# Patient Record
Sex: Male | Born: 1955 | Hispanic: Yes | Marital: Single | Smoking: Never smoker
Health system: Southern US, Community
[De-identification: ages and names within clinical notes are randomized; demographics above are authoritative.]

---

## 1997-11-21 ENCOUNTER — Other Ambulatory Visit: Admission: RE | Admit: 1997-11-21 | Discharge: 1997-11-21 | Payer: Self-pay | Admitting: Oral Surgery

## 2007-01-08 HISTORY — PX: OTHER SURGICAL HISTORY: SHX169

## 2014-10-05 ENCOUNTER — Other Ambulatory Visit: Payer: Self-pay

## 2014-10-05 ENCOUNTER — Other Ambulatory Visit: Payer: Self-pay | Admitting: Family Medicine

## 2014-10-06 LAB — CMP12+LP+TP+TSH+6AC+PSA+CBC…
A/G RATIO: 1.7 (ref 1.1–2.5)
ALBUMIN: 4.1 g/dL (ref 3.6–4.8)
ALT: 16 IU/L (ref 0–44)
AST: 25 IU/L (ref 0–40)
Alkaline Phosphatase: 90 IU/L (ref 39–117)
BUN/Creatinine Ratio: 15 (ref 10–22)
BUN: 12 mg/dL (ref 8–27)
Basophils Absolute: 0 10*3/uL (ref 0.0–0.2)
Basos: 1 %
Bilirubin Total: 0.7 mg/dL (ref 0.0–1.2)
CHOLESTEROL TOTAL: 191 mg/dL (ref 100–199)
CREATININE: 0.82 mg/dL (ref 0.76–1.27)
Calcium: 8.9 mg/dL (ref 8.6–10.2)
Chloride: 99 mmol/L (ref 97–108)
Chol/HDL Ratio: 3.8 ratio units (ref 0.0–5.0)
EOS (ABSOLUTE): 0.1 10*3/uL (ref 0.0–0.4)
Eos: 2 %
Estimated CHD Risk: 0.7 times avg. (ref 0.0–1.0)
FREE THYROXINE INDEX: 2.1 (ref 1.2–4.9)
GFR, EST AFRICAN AMERICAN: 111 mL/min/{1.73_m2} (ref >59)
GFR, EST NON AFRICAN AMERICAN: 96 mL/min/{1.73_m2} (ref >59)
GGT: 9 IU/L (ref 0–65)
GLUCOSE: 100 mg/dL — AB (ref 65–99)
Globulin, Total: 2.4 g/dL (ref 1.5–4.5)
HDL: 50 mg/dL (ref >39)
Hematocrit: 43.1 % (ref 37.5–51.0)
Hemoglobin: 14.6 g/dL (ref 12.6–17.7)
IRON: 116 ug/dL (ref 38–169)
Immature Grans (Abs): 0 10*3/uL (ref 0.0–0.1)
Immature Granulocytes: 0 %
LDH: 197 IU/L (ref 121–224)
LDL Calculated: 126 mg/dL — ABNORMAL HIGH (ref 0–99)
LYMPHS ABS: 1.3 10*3/uL (ref 0.7–3.1)
Lymphs: 32 %
MCH: 31.3 pg (ref 26.6–33.0)
MCHC: 33.9 g/dL (ref 31.5–35.7)
MCV: 92 fL (ref 79–97)
MONOS ABS: 0.4 10*3/uL (ref 0.1–0.9)
Monocytes: 11 %
NEUTROS ABS: 2.2 10*3/uL (ref 1.4–7.0)
Neutrophils: 54 %
PHOSPHORUS: 2.4 mg/dL — AB (ref 2.5–4.5)
POTASSIUM: 4 mmol/L (ref 3.5–5.2)
Platelets: 233 10*3/uL (ref 150–379)
Prostate Specific Ag, Serum: 0.9 ng/mL (ref 0.0–4.0)
RBC: 4.67 x10E6/uL (ref 4.14–5.80)
RDW: 13.3 % (ref 12.3–15.4)
Sodium: 139 mmol/L (ref 134–144)
T3 UPTAKE RATIO: 25 % (ref 24–39)
T4 TOTAL: 8.5 ug/dL (ref 4.5–12.0)
TOTAL PROTEIN: 6.5 g/dL (ref 6.0–8.5)
TSH: 1.17 u[IU]/mL (ref 0.450–4.500)
Triglycerides: 77 mg/dL (ref 0–149)
URIC ACID: 4.4 mg/dL (ref 3.7–8.6)
VLDL Cholesterol Cal: 15 mg/dL (ref 5–40)
WBC: 4.1 10*3/uL (ref 3.4–10.8)

## 2014-10-06 LAB — HGB A1C W/O EAG: HEMOGLOBIN A1C: 5.9 % — AB (ref 4.8–5.6)

## 2015-07-31 ENCOUNTER — Other Ambulatory Visit: Payer: Self-pay | Admitting: Family Medicine

## 2015-08-01 LAB — CMP12+LP+TP+TSH+6AC+PSA+CBC…
A/G RATIO: 1.7 (ref 1.2–2.2)
ALBUMIN: 4.4 g/dL (ref 3.6–4.8)
ALK PHOS: 80 IU/L (ref 39–117)
ALT: 16 IU/L (ref 0–44)
AST: 26 IU/L (ref 0–40)
BASOS ABS: 0 10*3/uL (ref 0.0–0.2)
BUN/Creatinine Ratio: 25 — ABNORMAL HIGH (ref 10–24)
BUN: 23 mg/dL (ref 8–27)
Basos: 1 %
Bilirubin Total: 0.9 mg/dL (ref 0.0–1.2)
CALCIUM: 9.1 mg/dL (ref 8.6–10.2)
CHOLESTEROL TOTAL: 181 mg/dL (ref 100–199)
Chloride: 100 mmol/L (ref 96–106)
Chol/HDL Ratio: 3.1 ratio units (ref 0.0–5.0)
Creatinine, Ser: 0.92 mg/dL (ref 0.76–1.27)
EOS (ABSOLUTE): 0.1 10*3/uL (ref 0.0–0.4)
Eos: 1 %
Estimated CHD Risk: <0.5 times avg. (ref 0.0–1.0)
FREE THYROXINE INDEX: 2.1 (ref 1.2–4.9)
GFR calc Af Amer: 103 mL/min/{1.73_m2} (ref >59)
GFR calc non Af Amer: 89 mL/min/{1.73_m2} (ref >59)
GGT: 9 IU/L (ref 0–65)
GLOBULIN, TOTAL: 2.6 g/dL (ref 1.5–4.5)
Glucose: 100 mg/dL — ABNORMAL HIGH (ref 65–99)
HDL: 58 mg/dL (ref >39)
Hematocrit: 41.1 % (ref 37.5–51.0)
Hemoglobin: 14.2 g/dL (ref 12.6–17.7)
IMMATURE GRANULOCYTES: 0 %
Immature Grans (Abs): 0 10*3/uL (ref 0.0–0.1)
Iron: 138 ug/dL (ref 38–169)
LDH: 200 IU/L (ref 121–224)
LDL Calculated: 108 mg/dL — ABNORMAL HIGH (ref 0–99)
LYMPHS ABS: 1.6 10*3/uL (ref 0.7–3.1)
Lymphs: 38 %
MCH: 31.2 pg (ref 26.6–33.0)
MCHC: 34.5 g/dL (ref 31.5–35.7)
MCV: 90 fL (ref 79–97)
MONOS ABS: 0.4 10*3/uL (ref 0.1–0.9)
Monocytes: 9 %
NEUTROS PCT: 51 %
Neutrophils Absolute: 2.2 10*3/uL (ref 1.4–7.0)
PLATELETS: 256 10*3/uL (ref 150–379)
PROSTATE SPECIFIC AG, SERUM: 1.2 ng/mL (ref 0.0–4.0)
Phosphorus: 2.6 mg/dL (ref 2.5–4.5)
Potassium: 4.4 mmol/L (ref 3.5–5.2)
RBC: 4.55 x10E6/uL (ref 4.14–5.80)
RDW: 13.3 % (ref 12.3–15.4)
SODIUM: 142 mmol/L (ref 134–144)
T3 Uptake Ratio: 25 % (ref 24–39)
T4 TOTAL: 8.3 ug/dL (ref 4.5–12.0)
TRIGLYCERIDES: 74 mg/dL (ref 0–149)
TSH: 1.02 u[IU]/mL (ref 0.450–4.500)
Total Protein: 7 g/dL (ref 6.0–8.5)
Uric Acid: 5.1 mg/dL (ref 3.7–8.6)
VLDL Cholesterol Cal: 15 mg/dL (ref 5–40)
WBC: 4.3 10*3/uL (ref 3.4–10.8)

## 2015-08-01 LAB — HGB A1C W/O EAG: Hgb A1c MFr Bld: 5.6 % (ref 4.8–5.6)

## 2016-07-23 ENCOUNTER — Ambulatory Visit: Payer: Self-pay | Admitting: *Deleted

## 2016-07-23 VITALS — BP 123/67 | HR 73 | Ht 66.5 in | Wt 137.0 lb

## 2016-07-23 DIAGNOSIS — Z0000 Encounter for general adult medical examination without abnormal findings: Secondary | ICD-10-CM

## 2016-07-23 NOTE — Progress Notes (Signed)
[employer] insurance premium discount evaluation: Labs Drawn. Replacements ROI form signed. Tobacco Free Attestation form signed.  Forms placed in paper chart.  

## 2016-07-24 LAB — CMP12+LP+TP+TSH+6AC+PSA+CBC…
A/G RATIO: 1.6 (ref 1.2–2.2)
ALBUMIN: 4.2 g/dL (ref 3.6–4.8)
ALT: 20 IU/L (ref 0–44)
AST: 22 IU/L (ref 0–40)
Alkaline Phosphatase: 84 IU/L (ref 39–117)
BASOS ABS: 0 10*3/uL (ref 0.0–0.2)
BILIRUBIN TOTAL: 0.3 mg/dL (ref 0.0–1.2)
BUN/Creatinine Ratio: 21 (ref 10–24)
BUN: 18 mg/dL (ref 8–27)
Basos: 1 %
CHOLESTEROL TOTAL: 176 mg/dL (ref 100–199)
Calcium: 8.9 mg/dL (ref 8.6–10.2)
Chloride: 101 mmol/L (ref 96–106)
Chol/HDL Ratio: 3.3 ratio (ref 0.0–5.0)
Creatinine, Ser: 0.87 mg/dL (ref 0.76–1.27)
EOS (ABSOLUTE): 0 10*3/uL (ref 0.0–0.4)
Eos: 1 %
FREE THYROXINE INDEX: 2.1 (ref 1.2–4.9)
GFR calc non Af Amer: 92 mL/min/{1.73_m2} (ref >59)
GFR, EST AFRICAN AMERICAN: 107 mL/min/{1.73_m2} (ref >59)
GGT: 7 IU/L (ref 0–65)
Globulin, Total: 2.7 g/dL (ref 1.5–4.5)
Glucose: 100 mg/dL — ABNORMAL HIGH (ref 65–99)
HDL: 53 mg/dL (ref >39)
Hematocrit: 42.1 % (ref 37.5–51.0)
Hemoglobin: 14.2 g/dL (ref 13.0–17.7)
IRON: 51 ug/dL (ref 38–169)
Immature Grans (Abs): 0 10*3/uL (ref 0.0–0.1)
Immature Granulocytes: 0 %
LDH: 211 IU/L (ref 121–224)
LDL Calculated: 113 mg/dL — ABNORMAL HIGH (ref 0–99)
LYMPHS ABS: 1.4 10*3/uL (ref 0.7–3.1)
Lymphs: 34 %
MCH: 31.5 pg (ref 26.6–33.0)
MCHC: 33.7 g/dL (ref 31.5–35.7)
MCV: 93 fL (ref 79–97)
MONOCYTES: 8 %
MONOS ABS: 0.3 10*3/uL (ref 0.1–0.9)
NEUTROS ABS: 2.3 10*3/uL (ref 1.4–7.0)
Neutrophils: 56 %
PLATELETS: 250 10*3/uL (ref 150–379)
POTASSIUM: 4.4 mmol/L (ref 3.5–5.2)
Phosphorus: 2.5 mg/dL (ref 2.5–4.5)
Prostate Specific Ag, Serum: 0.8 ng/mL (ref 0.0–4.0)
RBC: 4.51 x10E6/uL (ref 4.14–5.80)
RDW: 13.2 % (ref 12.3–15.4)
Sodium: 140 mmol/L (ref 134–144)
T3 UPTAKE RATIO: 27 % (ref 24–39)
T4 TOTAL: 7.9 ug/dL (ref 4.5–12.0)
TOTAL PROTEIN: 6.9 g/dL (ref 6.0–8.5)
TSH: 1.3 u[IU]/mL (ref 0.450–4.500)
Triglycerides: 51 mg/dL (ref 0–149)
Uric Acid: 4.3 mg/dL (ref 3.7–8.6)
VLDL CHOLESTEROL CAL: 10 mg/dL (ref 5–40)
WBC: 4 10*3/uL (ref 3.4–10.8)

## 2016-07-24 LAB — HGB A1C W/O EAG: HEMOGLOBIN A1C: 5.6 % (ref 4.8–5.6)

## 2016-07-25 NOTE — Progress Notes (Signed)
Results reviewed with pt. Diet and exercise recommendations for improving LDL and A1c given. All other labs unremarkable. No pcp to route to. Pt provided with copy.

## 2017-03-27 ENCOUNTER — Ambulatory Visit: Payer: Self-pay | Admitting: Registered Nurse

## 2017-03-27 VITALS — BP 137/70 | HR 81 | Temp 97.8°F | Resp 16

## 2017-03-27 DIAGNOSIS — S46811A Strain of other muscles, fascia and tendons at shoulder and upper arm level, right arm, initial encounter: Secondary | ICD-10-CM

## 2017-03-27 DIAGNOSIS — S46911A Strain of unspecified muscle, fascia and tendon at shoulder and upper arm level, right arm, initial encounter: Secondary | ICD-10-CM

## 2017-03-27 DIAGNOSIS — R2231 Localized swelling, mass and lump, right upper limb: Secondary | ICD-10-CM

## 2017-03-27 NOTE — Progress Notes (Signed)
Subjective:    Patient ID: Brian Long, male    DOB: 11-07-1955, 62 y.o.   MRN: [id number]  62y/o [place] male established patient reports upper R back and shoulder pain x1.5 weeks. Worse with certain movements or exertion. Endorses repetitive motion with job duties. Has not tried anything for pain relief at home except stretches.  Reported he has had back pain in the past was told he had arthritis but this is different sometimes radiating into right arm and he has also noticed he had a lump in his right hand.  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  Denied trauma.  Daily repetitive motions for his work/static positions.  Noticed neck/shoulder tightness last summer because it impinged his swimming stroke but it has recently been worsening.  Denied red flags loss of bowel/bladder control, saddle paresthesias and/or arm/leg weakness.     Review of Systems  Constitutional: Negative for appetite change, chills, diaphoresis, fatigue and fever.  HENT: Negative for trouble swallowing and voice change.   Eyes: Negative for photophobia and visual disturbance.  Respiratory: Negative for cough and shortness of breath.   Cardiovascular: Negative for chest pain.  Gastrointestinal: Negative for diarrhea, nausea and vomiting.  Endocrine: Negative for cold intolerance and heat intolerance.  Genitourinary: Negative for decreased urine volume and enuresis.  Musculoskeletal: Positive for back pain, myalgias and neck pain. Negative for arthralgias, gait problem, joint swelling and neck stiffness.  Skin: Negative for color change, pallor, rash and wound.  Allergic/Immunologic: Negative for environmental allergies and food allergies.  Neurological: Negative for dizziness, tremors, seizures, syncope, facial asymmetry, speech difficulty, weakness, light-headedness, numbness and headaches.  Hematological: Negative for adenopathy. Does not bruise/bleed easily.   Psychiatric/Behavioral: Negative for agitation, confusion and sleep disturbance.       Objective:   Physical Exam  Constitutional: He is oriented to person, place, and time. Vital signs are normal. He appears well-developed and well-nourished. He is cooperative.  Non-toxic appearance. He does not have a sickly appearance. He does not appear ill. No distress.  HENT:  Head: Normocephalic and atraumatic.  Right Ear: Hearing and external ear normal.  Left Ear: Hearing and external ear normal.  Nose: Nose normal.  Mouth/Throat: Uvula is midline, oropharynx is clear and moist and mucous membranes are normal. No oropharyngeal exudate.  Eyes: Pupils are equal, round, and reactive to light. Conjunctivae, EOM and lids are normal. Right eye exhibits no discharge. Left eye exhibits no discharge. No scleral icterus.  Neck: Trachea normal, normal range of motion and phonation normal. Neck supple. Muscular tenderness present. No spinous process tenderness present. No neck rigidity. No tracheal deviation, no edema, no erythema and normal range of motion present. No thyromegaly present.  Bilateral trapezius muscles tight and slightly TTP; full AROM  Cardiovascular: Normal rate, regular rhythm, S1 normal, S2 normal, normal heart sounds, intact distal pulses and normal pulses. PMI is not displaced. Exam reveals no gallop, no distant heart sounds and no friction rub.  Pulses:      Radial pulses are 2+ on the right side, and 2+ on the left side.  Pulmonary/Chest: Effort normal and breath sounds normal. No accessory muscle usage or stridor. No respiratory distress. He has no decreased breath sounds. He has no wheezes. He has no rhonchi. He has no rales. He exhibits no tenderness.  Abdominal: Soft. Normal appearance. He exhibits no distension, no fluid wave and no ascites. There is no tenderness. There is no rigidity and no guarding.  Musculoskeletal: He  exhibits tenderness and deformity. He exhibits no edema.        Right shoulder: He exhibits decreased range of motion, tenderness, pain and spasm. He exhibits no bony tenderness, no swelling, no effusion, no crepitus, no deformity, no laceration, normal pulse and normal strength.       Left shoulder: Normal.       Right elbow: Normal.      Left elbow: Normal.       Right wrist: Normal.       Left wrist: Normal.       Right hip: Normal.       Left hip: Normal.       Right knee: Normal.       Left knee: Normal.       Right ankle: Normal.       Left ankle: Normal.       Cervical back: He exhibits tenderness, pain and spasm. He exhibits no bony tenderness, no swelling, no edema, no deformity, no laceration and normal pulse.       Thoracic back: He exhibits normal range of motion, no tenderness, no bony tenderness, no swelling, no edema, no deformity, no laceration, no pain, no spasm and normal pulse.       Lumbar back: Normal. He exhibits normal range of motion, no tenderness, no bony tenderness, no swelling, no edema, no deformity, no laceration, no pain, no spasm and normal pulse.       Right upper arm: Normal.       Left upper arm: Normal.       Right forearm: He exhibits tenderness. He exhibits no bony tenderness, no swelling, no edema, no deformity and no laceration.       Left forearm: Normal.       Right hand: He exhibits tenderness. He exhibits normal range of motion, no bony tenderness, normal two-point discrimination, normal capillary refill, no deformity and no laceration. Normal sensation noted. Normal strength noted. He exhibits no finger abduction, no thumb/finger opposition and no wrist extension trouble.       Left hand: Normal.       Hands: Pea sized nonmobile nodule TTP in snuff box right hand no increased temperature no rash; full finger AROM and strength bilaterally equal; right hand more calloused than left; right forearm flexor carpi ulnaris TTP mildly; right subscapularis TTP superior, right levator scapulae, supraspinatus and posterior  medial superior deltoid and superior/medial bilateral trapezius TTP/spasm; right external shoulder rotation decreased 15 degrees from left; full internal rotation and atchley scratch test decreased AROM right above head but equal to left at waist; negative empty can test bilaterally; full cross body reach equal bilaterally  Lymphadenopathy:       Head (right side): No submental, no submandibular, no tonsillar, no preauricular, no posterior auricular and no occipital adenopathy present.       Head (left side): No submental, no submandibular, no tonsillar, no preauricular, no posterior auricular and no occipital adenopathy present.    He has no cervical adenopathy.       Right cervical: No superficial cervical, no deep cervical and no posterior cervical adenopathy present.      Left cervical: No superficial cervical, no deep cervical and no posterior cervical adenopathy present.  Neurological: He is alert and oriented to person, place, and time. He has normal strength. He is not disoriented. He displays no atrophy, no tremor and normal reflexes. No cranial nerve deficit or sensory deficit. He exhibits normal muscle tone. He displays no seizure  activity. Coordination and gait normal. GCS eye subscore is 4. GCS verbal subscore is 5. GCS motor subscore is 6.  Reflex Scores:      Patellar reflexes are 2+ on the right side and 2+ on the left side.      Achilles reflexes are 2+ on the right side and 2+ on the left side. Skin: Skin is warm, dry and intact. No rash noted. He is not diaphoretic. No cyanosis or erythema. No pallor. Nails show no clubbing.  Psychiatric: He has a normal mood and affect. His speech is normal and behavior is normal. Judgment and thought content normal. Cognition and memory are normal.  Nursing note and vitals reviewed.         Assessment & Plan:  A-initial encounter acute right shoulder strain/trapezius strain and right hand swelling  P-cyclobenazeprine/flexeril 10mg  take 1/2  to 1 tab po TID prn muscle spasms #30 RF0 dispensed from [facility].  Ibuprofen 800mg  po TID prn pain #30 RF0 dispensed from [facility].  Avoid alcohol intake and driving while taking cyclobenazeprine/flexeril as drowsiness common side effect.  Slow position changes as medication also lower blood pressure.  Home stretches demonstrated to patient-e.g. Arm circles, spiders walking up wall, chest stretches, neck AROM, chin tucks, knee to chest and rock side to side on back. Self massage or professional prn, foam roller use or tennis/racquetball.  Heat/cryotherapy 15 minutes QID prn.  Trial thermacare 1 applied and another given to patient for use tomorrow from clinic stock.  Applied biofreeze to forearm and scapula/trapezius in clinic also gave 4 UD for trial at home from clinic stock gel 1% apply QID.  Start home PT TID given rehab exercise handouts from [facility].   Consider physical therapy referral if no improvement with prescribed therapy from [facility] and/or chiropractic care.  Patient prefers not to use chiropractor.  Ensure ergonomics correct desk at work avoid repetitive motions if possible/holding phone/laptop in hand use desk/stand and/or break up lifting items into smaller loads/weights.  Patient was instructed to rest, ice, and ROM exercises.  Activity as tolerated.   Follow up if symptoms persist or worsen especially if loss of bowel/bladder control, arm/leg weakness e.g. Dropping items/falls and/or saddle paresthesias.  [facility] handout on muscle spasms and shoulder/neck rehab exercises/strain given to patient.  Patient verbalized agreement and understanding of treatment plan and had no further questions at this time.  P2:  Injury Prevention and Fitness.  Discussed with patient possible ganglion cyst right hand or neuroma.  Consider imaging and orthopedic consult.  Trial ice/NSAID motrin 800mg  po TID take with food.  Follow up re-evaluation in 2 weeks sooner if redness, worsening swelling/enlarging nodule, worsening  pain, hand weakness.  [facility] handout on ganglion cyst to patient.  Patient verbalized understanding information, instructions, agreed with plan of care and had no further questions at this time.

## 2017-03-27 NOTE — Patient Instructions (Addendum)
Ganglion Cyst A ganglion cyst is a noncancerous, fluid-filled lump that occurs near joints or tendons. The ganglion cyst grows out of a joint or the lining of a tendon. It most often develops in the hand or wrist, but it can also develop in the shoulder, elbow, hip, knee, ankle, or foot. The round or oval ganglion cyst can be the size of a pea or larger than a grape. Increased activity may enlarge the size of the cyst because more fluid starts to build up. What are the causes? It is not known what causes a ganglion cyst to grow. However, it may be related to:  Inflammation or irritation around the joint.  An injury.  Repetitive movements or overuse.  Arthritis.  What increases the risk? Risk factors include:  Being a woman.  Being age 58-50.  What are the signs or symptoms? Symptoms may include:  A lump. This most often appears on the hand or wrist, but it can occur in other areas of the body.  Tingling.  Pain.  Numbness.  Muscle weakness.  Weak grip.  Less movement in a joint.  How is this diagnosed? Ganglion cysts are most often diagnosed based on a physical exam. Your health care provider will feel the lump and may shine a light alongside it. If it is a ganglion cyst, a light often shines through it. Your health care provider may order an X-ray, ultrasound, or MRI to rule out other conditions. How is this treated? Ganglion cysts usually go away on their own without treatment. If pain or other symptoms are involved, treatment may be needed. Treatment is also needed if the ganglion cyst limits your movement or if it gets infected. Treatment may include:  Wearing a brace or splint on your wrist or finger.  Taking anti-inflammatory medicine.  Draining fluid from the lump with a needle (aspiration).  Injecting a steroid into the joint.  Surgery to remove the ganglion cyst.  Follow these instructions at home:  Do not press on the ganglion cyst, poke it with a  needle, or hit it.  Take medicines only as directed by your health care provider.  Wear your brace or splint as directed by your health care provider.  Watch your ganglion cyst for any changes.  Keep all follow-up visits as directed by your health care provider. This is important. Contact a health care provider if:  Your ganglion cyst becomes larger or more painful.  You have increased redness, red streaks, or swelling.  You have pus coming from the lump.  You have weakness or numbness in the affected area.  You have a fever or chills. This information is not intended to replace advice given to you by your health care provider. Make sure you discuss any questions you have with your health care provider. Document Released: 12/22/1999 Document Revised: 06/01/2015 Document Reviewed: 06/08/2013 Elsevier Interactive Patient Education  2018 [employer]. Muscle Cramps and Spasms Muscle cramps and spasms occur when a muscle or muscles tighten and you have no control over this tightening (involuntary muscle contraction). They are a common problem and can develop in any muscle. The most common place is in the calf muscles of the leg. Muscle cramps and muscle spasms are both involuntary muscle contractions, but there are some differences between the two:  Muscle cramps are painful. They come and go and may last a few seconds to 15 minutes. Muscle cramps are often more forceful and last longer than muscle spasms.  Muscle spasms may or  may not be painful. They may also last just a few seconds or much longer.  Certain medical conditions, such as diabetes or Parkinson disease, can make it more likely to develop cramps or spasms. However, cramps or spasms are usually not caused by a serious underlying problem. Common causes include:  Overexertion.  Overuse from repetitive motions, or doing the same thing over and over.  Remaining in a certain position for a long period of time.  Improper  preparation, form, or technique while playing a sport or doing an activity.  Dehydration.  Injury.  Side effects of some medicines.  Abnormally low levels of the salts and ions in your blood (electrolytes), especially potassium and calcium. This could happen if you are taking water pills (diuretics) or if you are pregnant.  In many cases, the cause of muscle cramps or spasms is unknown. Follow these instructions at home:  Stay well hydrated. Drink enough fluid to keep your urine clear or pale yellow.  Try massaging, stretching, and relaxing the affected muscle.  If directed, apply heat to tight or tense muscles as often as told by your health care provider. Use the heat source that your health care provider recommends, such as a moist heat pack or a heating pad. ? Place a towel between your skin and the heat source. ? Leave the heat on for 20-30 minutes. ? Remove the heat if your skin turns bright red. This is especially important if you are unable to feel pain, heat, or cold. You may have a greater risk of getting burned.  If directed, put ice on the affected area. This may help if you are sore or have pain after a cramp or spasm. ? Put ice in a plastic bag. ? Place a towel between your skin and the bag. ? Leavethe ice on for 20 minutes, 2-3 times a day.  Take over-the-counter and prescription medicines only as told by your health care provider.  Pay attention to any changes in your symptoms. Contact a health care provider if:  Your cramps or spasms get more severe or happen more often.  Your cramps or spasms do not improve over time. This information is not intended to replace advice given to you by your health care provider. Make sure you discuss any questions you have with your health care provider. Document Released: 06/15/2001 Document Revised: 01/25/2015 Document Reviewed: 09/27/2014 Elsevier Interactive Patient Education  2018 [employer]. Thoracic Strain A thoracic  strain, which is sometimes called a mid-back strain, is an injury to the muscles or tendons that attach to the upper part of your back behind your chest. This type of injury occurs when a muscle is overstretched or overloaded. Thoracic strains can range from mild to severe. Mild strains may involve stretching a muscle or tendon without tearing it. These injuries may heal in 1-2 weeks. More severe strains involve tearing of muscle fibers or tendons. These will cause more pain and may take 6-8 weeks to heal. What are the causes? This condition may be caused by:  An injury in which a sudden force is placed on the muscle.  Exercising without properly warming up.  Overuse of the muscle.  Improper form during certain movements.  Other injuries that surround or cause stress on the mid-back, causing a strain on the muscles.  In some cases, the cause may not be known. What increases the risk? This injury is more common in:  Athletes.  People with obesity.  What are the signs or  symptoms? The main symptom of this condition is pain, especially with movement. Other symptoms include:  Bruising.  Swelling.  Spasm.  How is this diagnosed? This condition may be diagnosed with a physical exam. X-rays may be taken to check for a fracture. How is this treated? This condition may be treated with:  Resting and icing the injured area.  Physical therapy. This will involve doing stretching and strengthening exercises.  Medicines for pain and inflammation.  Follow these instructions at home:  Rest as needed. Follow instructions from your health care provider about any restrictions on activity.  If directed, apply ice to the injured area: ? Put ice in a plastic bag. ? Place a towel between your skin and the bag. ? Leave the ice on for 20 minutes, 2-3 times per day.  Take over-the-counter and prescription medicines only as told by your health care provider.  Begin doing exercises as told  by your health care provider or physical therapist.  Always warm up properly before physical activity or sports.  Bend your knees before you lift heavy objects.  Keep all follow-up visits as told by your health care provider. This is important. Contact a health care provider if:  Your pain is not helped by medicine.  Your pain, bruising, or swelling is getting worse.  You have a fever. Get help right away if:  You have shortness of breath.  You have chest pain.  You develop numbness or weakness in your legs.  You have involuntary loss of urine (urinary incontinence). This information is not intended to replace advice given to you by your health care provider. Make sure you discuss any questions you have with your health care provider. Document Released: 03/16/2003 Document Revised: 08/26/2015 Document Reviewed: 02/17/2014 Elsevier Interactive Patient Education  2018 [employer]. Cervical Strain and Sprain Rehab Ask your health care provider which exercises are safe for you. Do exercises exactly as told by your health care provider and adjust them as directed. It is normal to feel mild stretching, pulling, tightness, or discomfort as you do these exercises, but you should stop right away if you feel sudden pain or your pain gets worse.Do not begin these exercises until told by your health care provider. Stretching and range of motion exercises These exercises warm up your muscles and joints and improve the movement and flexibility of your neck. These exercises also help to relieve pain, numbness, and tingling. Exercise A: Cervical side bend  1. Using good posture, sit on a stable chair or stand up. 2. Without moving your shoulders, slowly tilt your left / right ear to your shoulder until you feel a stretch in your neck muscles. You should be looking straight ahead. 3. Hold for _____5_____ seconds. 4. Repeat with the other side of your neck. Repeat ___3_______ times. Complete  this exercise __________ times a day. Exercise B: Cervical rotation  1. Using good posture, sit on a stable chair or stand up. 2. Slowly turn your head to the side as if you are looking over your left / right shoulder. ? Keep your eyes level with the ground. ? Stop when you feel a stretch along the side and the back of your neck. 3. Hold for ______5____ seconds. 4. Repeat this by turning to your other side. Repeat _____2_____ times. Complete this exercise _____3_____ times a day. Exercise C: Thoracic extension and pectoral stretch 1. Roll a towel or a small blanket so it is about 4 inches (10 cm) in diameter. 2. Shanda Howells  down on your back on a firm surface. 3. Put the towel lengthwise, under your spine in the middle of your back. It should not be not under your shoulder blades. The towel should line up with your spine from your middle back to your lower back. 4. Put your hands behind your head and let your elbows fall out to your sides. 5. Hold for ____10______ seconds. Repeat ____3______ times. Complete this exercise ____3______ times a day. Strengthening exercises These exercises build strength and endurance in your neck. Endurance is the ability to use your muscles for a long time, even after your muscles get tired. Exercise D: Upper cervical flexion, isometric 1. Lie on your back with a thin pillow behind your head and a small rolled-up towel under your neck. 2. Gently tuck your chin toward your chest and nod your head down to look toward your feet. Do not lift your head off the pillow. 3. Hold for ______10____ seconds. 4. Release the tension slowly. Relax your neck muscles completely before you repeat this exercise. Repeat ____3______ times. Complete this exercise ____3______ times a day. Exercise E: Cervical extension, isometric  1. Stand about 6 inches (15 cm) away from a wall, with your back facing the wall. 2. Place a soft object, about 6-8 inches (15-20 cm) in diameter, between the  back of your head and the wall. A soft object could be a small pillow, a ball, or a folded towel. 3. Gently tilt your head back and press into the soft object. Keep your jaw and forehead relaxed. 4. Hold for ___5_____ seconds. 5. Release the tension slowly. Relax your neck muscles completely before you repeat this exercise. Repeat _____3_____ times. Complete this exercise _____3 Shoulder Exercises Ask your health care provider which exercises are safe for you. Do exercises exactly as told by your health care provider and adjust them as directed. It is normal to feel mild stretching, pulling, tightness, or discomfort as you do these exercises, but you should stop right away if you feel sudden pain or your pain gets worse.Do not begin these exercises until told by your health care provider. RANGE OF MOTION EXERCISES These exercises warm up your muscles and joints and improve the movement and flexibility of your shoulder. These exercises also help to relieve pain, numbness, and tingling. These exercises involve stretching your injured shoulder directly. Exercise A: Pendulum  1. Stand near a wall or a surface that you can hold onto for balance. 2. Bend at the waist and let your left / right arm hang straight down. Use your other arm to support you. Keep your back straight and do not lock your knees. 3. Relax your left / right arm and shoulder muscles, and move your hips and your trunk so your left / right arm swings freely. Your arm should swing because of the motion of your body, not because you are using your arm or shoulder muscles. 4. Keep moving your body so your arm swings in the following directions, as told by your health care provider: ? Side to side. ? Forward and backward. ? In clockwise and counterclockwise circles. 5. Continue each motion for __________ seconds, or for as long as told by your health care provider. 6. Slowly return to the starting position. Repeat ____3______ times.  Complete this exercise _____3_____ times a day. Exercise B:Flexion, Standing  1. Stand and hold a broomstick, a cane, or a similar object. Place your hands a little more than shoulder-width apart on the object. Your left /  right hand should be palm-up, and your other hand should be palm-down. 2. Keep your elbow straight and keep your shoulder muscles relaxed. Push the stick down with your healthy arm to raise your left / right arm in front of your body, and then over your head until you feel a stretch in your shoulder. ? Avoid shrugging your shoulder while you raise your arm. Keep your shoulder blade tucked down toward the middle of your back. 3. Hold for _____5_____ seconds. 4. Slowly return to the starting position. Repeat _____3_____ times. Complete this exercise ____3______ times a day. Exercise C: Abduction, Standing 1. Stand and hold a broomstick, a cane, or a similar object. Place your hands a little more than shoulder-width apart on the object. Your left / right hand should be palm-up, and your other hand should be palm-down. 2. While keeping your elbow straight and your shoulder muscles relaxed, push the stick across your body toward your left / right side. Raise your left / right arm to the side of your body and then over your head until you feel a stretch in your shoulder. ? Do not raise your arm above shoulder height, unless your health care provider tells you to do that. ? Avoid shrugging your shoulder while you raise your arm. Keep your shoulder blade tucked down toward the middle of your back. 3. Hold for ____5______ seconds. 4. Slowly return to the starting position. Repeat ______3____ times. Complete this exercise _____3_____ times a day. Exercise D:Internal Rotation  1. Place your left / right hand behind your back, palm-up. 2. Use your other hand to dangle an exercise band, a towel, or a similar object over your shoulder. Grasp the band with your left / right hand so you are  holding onto both ends. 3. Gently pull up on the band until you feel a stretch in the front of your left / right shoulder. ? Avoid shrugging your shoulder while you raise your arm. Keep your shoulder blade tucked down toward the middle of your back. 4. Hold for _____5_____ seconds. 5. Release the stretch by letting go of the band and lowering your hands. Repeat _____3_____ times. Complete this exercise ____3______ times a day. STRETCHING EXERCISES These exercises warm up your muscles and joints and improve the movement and flexibility of your shoulder. These exercises also help to relieve pain, numbness, and tingling. These exercises are done using your healthy shoulder to help stretch the muscles of your injured shoulder. Exercise E: Warehouse manager (External Rotation and Abduction)  1. Stand in a doorway with one of your feet slightly in front of the other. This is called a staggered stance. If you cannot reach your forearms to the door frame, stand facing a corner of a room. 2. Choose one of the following positions as told by your health care provider: ? Place your hands and forearms on the door frame above your head. ? Place your hands and forearms on the door frame at the height of your head. ? Place your hands on the door frame at the height of your elbows. 3. Slowly move your weight onto your front foot until you feel a stretch across your chest and in the front of your shoulders. Keep your head and chest upright and keep your abdominal muscles tight. 4. Hold for _____5_____ seconds. 5. To release the stretch, shift your weight to your back foot. Repeat ______3____ times. Complete this stretch __3________ times a day. Exercise F:Extension, Standing 1. Stand and hold a broomstick,  a cane, or a similar object behind your back. ? Your hands should be a little wider than shoulder-width apart. ? Your palms should face away from your back. 2. Keeping your elbows straight and keeping your  shoulder muscles relaxed, move the stick away from your body until you feel a stretch in your shoulder. ? Avoid shrugging your shoulders while you move the stick. Keep your shoulder blade tucked down toward the middle of your back. 3. Hold for ____5______ seconds. 4. Slowly return to the starting position. Repeat ___3_______ times. Complete this exercise ____3______ times a day. STRENGTHENING EXERCISES These exercises build strength and endurance in your shoulder. Endurance is the ability to use your muscles for a long time, even after they get tired. Exercise G:External Rotation  1. Sit in a stable chair without armrests. 2. Secure an exercise band at elbow height on your left / right side. 3. Place a soft object, such as a folded towel or a small pillow, between your left / right upper arm and your body to move your elbow a few inches away (about 10 cm) from your side. 4. Hold the end of the band so it is tight and there is no slack. 5. Keeping your elbow pressed against the soft object, move your left / right forearm out, away from your abdomen. Keep your body steady so only your forearm moves. 6. Hold for _____5_____ seconds. 7. Slowly return to the starting position. Repeat ____3______ times. Complete this exercise ______3____ times a day. Exercise H:Shoulder Abduction  1. Sit in a stable chair without armrests, or stand. 2. Hold a _____1lb_____ weight in your left / right hand, or hold an exercise band with both hands. 3. Start with your arms straight down and your left / right palm facing in, toward your body. 4. Slowly lift your left / right hand out to your side. Do not lift your hand above shoulder height unless your health care provider tells you that this is safe. ? Keep your arms straight. ? Avoid shrugging your shoulder while you do this movement. Keep your shoulder blade tucked down toward the middle of your back. 5. Hold for ____5______ seconds. 6. Slowly lower your arm,  and return to the starting position. Repeat _____3_____ times. Complete this exercise ______3____ times a day. Exercise I:Shoulder Extension 1. Sit in a stable chair without armrests, or stand. 2. Secure an exercise band to a stable object in front of you where it is at shoulder height. 3. Hold one end of the exercise band in each hand. Your palms should face each other. 4. Straighten your elbows and lift your hands up to shoulder height. 5. Step back, away from the secured end of the exercise band, until the band is tight and there is no slack. 6. Squeeze your shoulder blades together as you pull your hands down to the sides of your thighs. Stop when your hands are straight down by your sides. Do not let your hands go behind your body. 7. Hold for ______5____ seconds. 8. Slowly return to the starting position. Repeat _____3_____ times. Complete this exercise ____3______ times a day. Exercise J:Standing Shoulder Row 1. Sit in a stable chair without armrests, or stand. 2. Secure an exercise band to a stable object in front of you so it is at waist height. 3. Hold one end of the exercise band in each hand. Your palms should be in a thumbs-up position. 4. Bend each of your elbows to an "L" shape (about  90 degrees) and keep your upper arms at your sides. 5. Step back until the band is tight and there is no slack. 6. Slowly pull your elbows back behind you. 7. Hold for ____5______ seconds. 8. Slowly return to the starting position. Repeat _____3_____ times. Complete this exercise ____3____ times a day. Exercise K:Shoulder Press-Ups  1. Sit in a stable chair that has armrests. Sit upright, with your feet flat on the floor. 2. Put your hands on the armrests so your elbows are bent and your fingers are pointing forward. Your hands should be about even with the sides of your body. 3. Push down on the armrests and use your arms to lift yourself off of the chair. Straighten your elbows and lift  yourself up as much as you comfortably can. ? Move your shoulder blades down, and avoid letting your shoulders move up toward your ears. ? Keep your feet on the ground. As you get stronger, your feet should support less of your body weight as you lift yourself up. 4. Hold for _____5_____ seconds. 5. Slowly lower yourself back into the chair. Repeat ____3______ times. Complete this exercise ___3______ times a day. Exercise L: Wall Push-Ups  1. Stand so you are facing a stable wall. Your feet should be about one arm-length away from the wall. 2. Lean forward and place your palms on the wall at shoulder height. 3. Keep your feet flat on the floor as you bend your elbows and lean forward toward the wall. 4. Hold for ___5_______ seconds. 5. Straighten your elbows to push yourself back to the starting position. Repeat _____3_____ times. Complete this exercise ____3_____ times a day. This information is not intended to replace advice given to you by your health care provider. Make sure you discuss any questions you have with your health care provider. Document Released: 11/07/2004 Document Revised: 09/18/2015 Document Reviewed: 09/04/2014 Elsevier Interactive Patient Education  [employer]. ___3__ times a day. Posture and body mechanics  Body mechanics refers to the movements and positions of your body while you do your daily activities. Posture is part of body mechanics. Good posture and healthy body mechanics can help to relieve stress in your body's tissues and joints. Good posture means that your spine is in its natural S-curve position (your spine is neutral), your shoulders are pulled back slightly, and your head is not tipped forward. The following are general guidelines for applying improved posture and body mechanics to your everyday activities. Standing  When standing, keep your spine neutral and keep your feet about hip-width apart. Keep a slight bend in your knees. Your ears,  shoulders, and hips should line up.  When you do a task in which you stand in one place for a long time, place one foot up on a stable object that is 2-4 inches (5-10 cm) high, such as a footstool. This helps keep your spine neutral. Sitting   When sitting, keep your spine neutral and your keep feet flat on the floor. Use a footrest, if necessary, and keep your thighs parallel to the floor. Avoid rounding your shoulders, and avoid tilting your head forward.  When working at a desk or a computer, keep your desk at a height where your hands are slightly lower than your elbows. Slide your chair under your desk so you are close enough to maintain good posture.  When working at a computer, place your monitor at a height where you are looking straight ahead and you do not have to  tilt your head forward or downward to look at the screen. Resting When lying down and resting, avoid positions that are most painful for you. Try to support your neck in a neutral position. You can use a contour pillow or a small rolled-up towel. Your pillow should support your neck but not push on it. This information is not intended to replace advice given to you by your health care provider. Make sure you discuss any questions you have with your health care provider. Document Released: 12/24/2004 Document Revised: 08/31/2015 Document Reviewed: 11/30/2014 Elsevier Interactive Patient Education  [employer].

## 2017-03-29 ENCOUNTER — Encounter: Payer: Self-pay | Admitting: Registered Nurse

## 2017-12-11 ENCOUNTER — Ambulatory Visit: Payer: Self-pay | Admitting: Registered Nurse

## 2017-12-11 ENCOUNTER — Encounter: Payer: Self-pay | Admitting: Registered Nurse

## 2017-12-11 VITALS — BP 149/72 | HR 87 | Temp 99.8°F

## 2017-12-11 DIAGNOSIS — J069 Acute upper respiratory infection, unspecified: Secondary | ICD-10-CM

## 2017-12-11 DIAGNOSIS — B9789 Other viral agents as the cause of diseases classified elsewhere: Principal | ICD-10-CM

## 2017-12-11 MED ORDER — SALINE SPRAY 0.65 % NA SOLN: 2.0000 | NASAL | 0 refills | Status: DC

## 2017-12-11 MED ORDER — PHENYLEPHRINE HCL 10 MG PO TABS: 10.0000 mg | ORAL_TABLET | Freq: Four times a day (QID) | ORAL | Status: AC | PRN

## 2017-12-11 MED ORDER — FLUTICASONE PROPIONATE 50 MCG/ACT NA SUSP: 1.0000 | Freq: Two times a day (BID) | NASAL | 0 refills | Status: DC

## 2017-12-11 MED ORDER — BENZONATATE 200 MG PO CAPS: 200.0000 mg | ORAL_CAPSULE | Freq: Two times a day (BID) | ORAL | 0 refills | Status: AC | PRN

## 2017-12-11 NOTE — Progress Notes (Signed)
Subjective:    Patient ID: Brian Long, male    DOB: 01/28/1955, 62 y.o.   MRN: [id number]  62y/o Hispanic male pt c/o R maxillary sinus pain and pressure and dry nonproductive cough since waking up this morning. Now with itchy, sore throat. Given phenylephrine 10mg  at approx 10am today, reports no improvement in sx currently. Also given a bottle of nasal saline spray with instructions to use q2h while awake and as a sinus rinse in shower BID. Using Ricola cough drops but no OTC meds at home for sx.  2 sick coworkers with cough and runny nose also in his dept     Review of Systems  Constitutional: Negative for activity change, appetite change, chills, diaphoresis, fatigue, fever and unexpected weight change.  HENT: Positive for congestion, postnasal drip, rhinorrhea, sinus pressure, sinus pain and sore throat. Negative for dental problem, drooling, ear discharge, ear pain, facial swelling, hearing loss, mouth sores, nosebleeds, sneezing, tinnitus, trouble swallowing and voice change.   Eyes: Negative for photophobia, pain, discharge, redness, itching and visual disturbance.  Respiratory: Positive for cough. Negative for choking, chest tightness, shortness of breath, wheezing and stridor.   Cardiovascular: Negative for chest pain, palpitations and leg swelling.  Gastrointestinal: Negative for abdominal distention, abdominal pain, diarrhea, nausea and vomiting.  Endocrine: Negative for cold intolerance and heat intolerance.  Genitourinary: Negative for dysuria.  Musculoskeletal: Negative for arthralgias, back pain, gait problem, joint swelling, myalgias, neck pain and neck stiffness.  Skin: Negative for color change, pallor, rash and wound.  Allergic/Immunologic: Negative for environmental allergies, food allergies and immunocompromised state.  Neurological: Negative for dizziness, tremors, seizures, syncope, facial asymmetry, speech difficulty, weakness, light-headedness, numbness and  headaches.  Hematological: Negative for adenopathy. Does not bruise/bleed easily.  Psychiatric/Behavioral: Negative for agitation, behavioral problems, confusion and sleep disturbance.       Objective:   Physical Exam  Constitutional: He is oriented to person, place, and time. Vital signs are normal. He appears well-developed and well-nourished. He is active and cooperative.  Non-toxic appearance. He does not have a sickly appearance. He appears ill. No distress.  HENT:  Head: Normocephalic and atraumatic.  Right Ear: Hearing, external ear and ear canal normal. A middle ear effusion is present.  Left Ear: Hearing, external ear and ear canal normal. A middle ear effusion is present.  Nose: Mucosal edema and rhinorrhea present. No nose lacerations, sinus tenderness, nasal deformity, septal deviation or nasal septal hematoma. No epistaxis.  No foreign bodies. Right sinus exhibits no maxillary sinus tenderness and no frontal sinus tenderness. Left sinus exhibits no maxillary sinus tenderness and no frontal sinus tenderness.  Mouth/Throat: Uvula is midline and mucous membranes are normal. Mucous membranes are not pale, not dry and not cyanotic. He does not have dentures. No oral lesions. No trismus in the jaw. Normal dentition. No dental abscesses, uvula swelling, lacerations or dental caries. Posterior oropharyngeal edema and posterior oropharyngeal erythema present. No oropharyngeal exudate or tonsillar abscesses. Tonsils are 0 on the right. Tonsils are 0 on the left. No tonsillar exudate.  Bilateral TMs air fluid level clear; cobblestoning posterior pharynx; bilateral allergic shiners; clear discharge bilateral nasal turbinates edema erythema;   Eyes: Pupils are equal, round, and reactive to light. Conjunctivae, EOM and lids are normal. Right eye exhibits no chemosis, no discharge, no exudate and no hordeolum. No foreign body present in the right eye. Left eye exhibits no chemosis, no discharge, no  exudate and no hordeolum. No foreign body present in the left  eye. Right conjunctiva is not injected. Right conjunctiva has no hemorrhage. Left conjunctiva is not injected. Left conjunctiva has no hemorrhage. No scleral icterus. Right eye exhibits normal extraocular motion and no nystagmus. Left eye exhibits normal extraocular motion and no nystagmus. Right pupil is round and reactive. Left pupil is round and reactive. Pupils are equal.  Neck: Trachea normal and normal range of motion. Neck supple. No tracheal tenderness, no spinous process tenderness and no muscular tenderness present. No neck rigidity. No tracheal deviation, no edema, no erythema and normal range of motion present. No thyroid mass and no thyromegaly present.  Cardiovascular: Normal rate, regular rhythm, S1 normal, S2 normal, normal heart sounds and intact distal pulses. PMI is not displaced. Exam reveals no gallop and no friction rub.  No murmur heard. Pulmonary/Chest: Effort normal and breath sounds normal. No stridor. No respiratory distress. He has no decreased breath sounds. He has no wheezes. He has no rhonchi. He has no rales.  Intermittent nonproductive cough in exam room; spoke full sentences without difficulty  Abdominal: Soft. He exhibits no distension, no fluid wave and no ascites. There is no rigidity and no guarding.  Musculoskeletal: Normal range of motion. He exhibits no edema, tenderness or deformity.       Right shoulder: Normal.       Left shoulder: Normal.       Right elbow: Normal.      Left elbow: Normal.       Right hip: Normal.       Left hip: Normal.       Right knee: Normal.       Left knee: Normal.       Right ankle: Normal.       Left ankle: Normal.       Cervical back: Normal.       Thoracic back: Normal.       Lumbar back: Normal.       Right hand: Normal.       Left hand: Normal.  Lymphadenopathy:       Head (right side): No submental, no submandibular, no tonsillar, no preauricular, no  posterior auricular and no occipital adenopathy present.       Head (left side): No submental, no submandibular, no tonsillar, no preauricular, no posterior auricular and no occipital adenopathy present.    He has no cervical adenopathy.       Right cervical: No superficial cervical, no deep cervical and no posterior cervical adenopathy present.      Left cervical: No superficial cervical, no deep cervical and no posterior cervical adenopathy present.  Neurological: He is alert and oriented to person, place, and time. He has normal strength. He is not disoriented. He displays no atrophy and no tremor. No cranial nerve deficit or sensory deficit. He exhibits normal muscle tone. He displays no seizure activity. Coordination and gait normal. GCS eye subscore is 4. GCS verbal subscore is 5. GCS motor subscore is 6.  On/off exam table and in/out of chair without difficulty; gait sure and steady in hallway  Skin: Skin is warm, dry and intact. Capillary refill takes less than 2 seconds. No abrasion, no bruising, no burn, no ecchymosis, no laceration, no lesion, no petechiae and no rash noted. He is not diaphoretic. No cyanosis or erythema. No pallor. Nails show no clubbing.  Psychiatric: He has a normal mood and affect. His speech is normal and behavior is normal. Judgment and thought content normal. He is not actively hallucinating. Cognition and  memory are normal. He is attentive.  Nursing note and vitals reviewed.         Assessment & Plan:  A-viral URI with cough  P-Handwashing avoid touching face at work.  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  Patient may use normal saline nasal spray 2 sprays each nostril q2h wa as needed given 1 bottle from clinic stock.  Phenylephrine 10mg  po q6h prn rhintiis 4 UD given from clinic stock.  Tylenol 1000mg  po QID prn pain/fever.  Hydrate to keep urine pale clear and yellow tinged.  Honey with lemon for cough.  Cough lozenge  po q2h prn cough.  May use otc cough syrup per manufacturer instructions.. flonase 66mcg 1 spray each nostril BID #1 RF0 electronic Rx to pharmacy of his choice.  Tessalon pearles 200mg  po TID prn cough #30 RF0 electronic rx to his pharmacy of choice.  Patient denied personal or family history of ENT cancer.  OTC antihistamine of choice claritin/zyrtec 10mg  po daily.  Avoid triggers if possible.  Shower prior to bedtime if exposed to triggers  Call or return to clinic as needed if these symptoms worsen or fail to improve as anticipated.   Exitcare handout viral URI and sinus rinse.  Patient verbalized understanding of instructions, agreed with plan of care and had no further questions at this time.  P2:  Avoidance and hand washing.

## 2017-12-11 NOTE — Patient Instructions (Signed)
Sinus Rinse What is a sinus rinse? A sinus rinse is a simple home treatment that is used to rinse your sinuses with a sterile mixture of salt and water (saline solution). Sinuses are air-filled spaces in your skull behind the bones of your face and forehead that open into your nasal cavity. You will use the following:  Saline solution.  Neti pot or spray bottle. This releases the saline solution into your nose and through your sinuses. Neti pots and spray bottles can be purchased at [employer], a health food store, or online.  When would I do a sinus rinse? A sinus rinse can help to clear mucus, dirt, dust, or pollen from the nasal cavity. You may do a sinus rinse when you have a cold, a virus, nasal allergy symptoms, a sinus infection, or stuffiness in the nose or sinuses. If you are considering a sinus rinse:  Ask your child's health care provider before performing a sinus rinse on your child.  Do not do a sinus rinse if you have had ear or nasal surgery, ear infection, or blocked ears.  How do I do a sinus rinse?  Wash your hands.  Disinfect your device according to the directions provided and then dry it.  Use the solution that comes with your device or one that is sold separately in stores. Follow the mixing directions on the package.  Fill your device with the amount of saline solution as directed by the device instructions.  Stand over a sink and tilt your head sideways over the sink.  Place the spout of the device in your upper nostril (the one closer to the ceiling).  Gently pour or squeeze the saline solution into the nasal cavity. The liquid should drain to the lower nostril if you are not overly congested.  Gently blow your nose. Blowing too hard may cause ear pain.  Repeat in the other nostril.  Clean and rinse your device with clean water and then air-dry it. Are there risks of a sinus rinse? Sinus rinse is generally very safe and effective. However,  there are a few risks, which include:  A burning sensation in the sinuses. This may happen if you do not make the saline solution as directed. Make sure to follow all directions when making the saline solution.  Infection from contaminated water. This is rare, but possible.  Nasal irritation.  This information is not intended to replace advice given to you by your health care provider. Make sure you discuss any questions you have with your health care provider. Document Released: 07/21/2013 Document Revised: 11/21/2015 Document Reviewed: 05/11/2013 Elsevier Interactive Patient Education  2017 [place]. Viral Respiratory Infection A respiratory infection is an illness that affects part of the respiratory system, such as the lungs, nose, or throat. Most respiratory infections are caused by either viruses or bacteria. A respiratory infection that is caused by a virus is called a viral respiratory infection. Common types of viral respiratory infections include:  A cold.  The flu (influenza).  A respiratory syncytial virus (RSV) infection.  How do I know if I have a viral respiratory infection? Most viral respiratory infections cause:  A stuffy or runny nose.  Yellow or green nasal discharge.  A cough.  Sneezing.  Fatigue.  Achy muscles.  A sore throat.  Sweating or chills.  A fever.  A headache.  How are viral respiratory infections treated? If influenza is diagnosed early, it may be treated with an antiviral medicine that shortens  the length of time a person has symptoms. Symptoms of viral respiratory infections may be treated with over-the-counter and prescription medicines, such as:  Expectorants. These make it easier to cough up mucus.  Decongestant nasal sprays.  Health care providers do not prescribe antibiotic medicines for viral infections. This is because antibiotics are designed to kill bacteria. They have no effect on viruses. How do I know if I should  stay home from work or school? To avoid exposing others to your respiratory infection, stay home if you have:  A fever.  A persistent cough.  A sore throat.  A runny nose.  Sneezing.  Muscles aches.  Headaches.  Fatigue.  Weakness.  Chills.  Sweating.  Nausea.  Follow these instructions at home:  Rest as much as possible.  Take over-the-counter and prescription medicines only as told by your health care provider.  Drink enough fluid to keep your urine clear or pale yellow. This helps prevent dehydration and helps loosen up mucus.  Gargle with a salt-water mixture 3-4 times per day or as needed. To make a salt-water mixture, completely dissolve -1 tsp of salt in 1 cup of warm water.  Use nose drops made from salt water to ease congestion and soften raw skin around your nose.  Do not drink alcohol.  Do not use tobacco products, including cigarettes, chewing tobacco, and e-cigarettes. If you need help quitting, ask your health care provider. Contact a health care provider if:  Your symptoms last for 10 days or longer.  Your symptoms get worse over time.  You have a fever.  You have severe sinus pain in your face or forehead.  The glands in your jaw or neck become very swollen. Get help right away if:  You feel pain or pressure in your chest.  You have shortness of breath.  You faint or feel like you will faint.  You have severe and persistent vomiting.  You feel confused or disoriented. This information is not intended to replace advice given to you by your health care provider. Make sure you discuss any questions you have with your health care provider. Document Released: 10/03/2004 Document Revised: 06/01/2015 Document Reviewed: 06/01/2014 Elsevier Interactive Patient Education  [employer].

## 2018-01-07 DIAGNOSIS — N200 Calculus of kidney: Secondary | ICD-10-CM

## 2018-01-07 DIAGNOSIS — J111 Influenza due to unidentified influenza virus with other respiratory manifestations: Secondary | ICD-10-CM

## 2018-01-07 HISTORY — DX: Calculus of kidney: N20.0

## 2018-01-07 HISTORY — DX: Influenza due to unidentified influenza virus with other respiratory manifestations: J11.1

## 2018-01-20 ENCOUNTER — Encounter: Payer: Self-pay | Admitting: Registered Nurse

## 2018-01-20 ENCOUNTER — Telehealth: Payer: Self-pay | Admitting: Registered Nurse

## 2018-01-20 MED ORDER — FLUTICASONE PROPIONATE 50 MCG/ACT NA SUSP: 1.0000 | Freq: Two times a day (BID) | NASAL | 1 refills | Status: DC

## 2018-01-20 NOTE — Telephone Encounter (Signed)
Refilled flonase nasal 77mcg 1 spray each nostril BID prn rhinitis/congestion #1 RF1 electronic Rx to his pharmacy of choice

## 2018-01-21 ENCOUNTER — Emergency Department (HOSPITAL_COMMUNITY): Payer: No Typology Code available for payment source

## 2018-01-21 ENCOUNTER — Emergency Department (HOSPITAL_COMMUNITY)
Admission: EM | Admit: 2018-01-21 | Discharge: 2018-01-21 | Disposition: A | Discharge status: Home / Self Care | Payer: No Typology Code available for payment source | Attending: Emergency Medicine | Admitting: Emergency Medicine

## 2018-01-21 ENCOUNTER — Encounter (HOSPITAL_COMMUNITY): Payer: Self-pay | Admitting: *Deleted

## 2018-01-21 ENCOUNTER — Other Ambulatory Visit: Payer: Self-pay

## 2018-01-21 DIAGNOSIS — R109 Unspecified abdominal pain: Secondary | ICD-10-CM

## 2018-01-21 DIAGNOSIS — R319 Hematuria, unspecified: Secondary | ICD-10-CM | POA: Insufficient documentation

## 2018-01-21 DIAGNOSIS — R1084 Generalized abdominal pain: Secondary | ICD-10-CM | POA: Diagnosis present

## 2018-01-21 LAB — CBC
HCT: 39.6 % (ref 39.0–52.0)
Hemoglobin: 12.9 g/dL — ABNORMAL LOW (ref 13.0–17.0)
MCH: 30.9 pg (ref 26.0–34.0)
MCHC: 32.6 g/dL (ref 30.0–36.0)
MCV: 94.7 fL (ref 80.0–100.0)
NRBC: 0 % (ref 0.0–0.2)
Platelets: 215 10*3/uL (ref 150–400)
RBC: 4.18 MIL/uL — ABNORMAL LOW (ref 4.22–5.81)
RDW: 12.2 % (ref 11.5–15.5)
WBC: 8.9 10*3/uL (ref 4.0–10.5)

## 2018-01-21 LAB — URINALYSIS, ROUTINE W REFLEX MICROSCOPIC
Bacteria, UA: NONE SEEN
Bilirubin Urine: NEGATIVE
Glucose, UA: NEGATIVE mg/dL
Ketones, ur: 80 mg/dL — AB
Nitrite: NEGATIVE
Protein, ur: 30 mg/dL — AB
RBC / HPF: >50 RBC/hpf — ABNORMAL HIGH (ref 0–5)
SPECIFIC GRAVITY, URINE: 1.028 (ref 1.005–1.030)
pH: 5 (ref 5.0–8.0)

## 2018-01-21 LAB — COMPREHENSIVE METABOLIC PANEL
ALT: 18 U/L (ref 0–44)
AST: 21 U/L (ref 15–41)
Albumin: 3.4 g/dL — ABNORMAL LOW (ref 3.5–5.0)
Alkaline Phosphatase: 58 U/L (ref 38–126)
Anion gap: 11 (ref 5–15)
BUN: 18 mg/dL (ref 8–23)
CO2: 26 mmol/L (ref 22–32)
Calcium: 8.9 mg/dL (ref 8.9–10.3)
Chloride: 103 mmol/L (ref 98–111)
Creatinine, Ser: 1.42 mg/dL — ABNORMAL HIGH (ref 0.61–1.24)
GFR calc non Af Amer: 52 mL/min — ABNORMAL LOW (ref >60)
Glucose, Bld: 104 mg/dL — ABNORMAL HIGH (ref 70–99)
Potassium: 3.7 mmol/L (ref 3.5–5.1)
SODIUM: 140 mmol/L (ref 135–145)
Total Bilirubin: 1.3 mg/dL — ABNORMAL HIGH (ref 0.3–1.2)
Total Protein: 6.9 g/dL (ref 6.5–8.1)

## 2018-01-21 LAB — LIPASE, BLOOD: LIPASE: 34 U/L (ref 11–51)

## 2018-01-21 MED ORDER — SODIUM CHLORIDE 0.9 % IV BOLUS: 500.0000 mL | Freq: Once | INTRAVENOUS | Status: DC

## 2018-01-21 NOTE — ED Provider Notes (Signed)
[facility] EMERGENCY DEPARTMENT Provider Note   CSN: [id number] Arrival date & time: 01/21/18  8299     History   Chief Complaint Chief Complaint  Patient presents with  . Abdominal Pain    HPI Brian Long is a 63 y.o. male.  HPI   Pt is a 63 y/o male who presents to the ED today c/o LLQ abd pain that began 4 days ago. Pain is intermittent. Pain worsens when he eats. Pain radiates to the left flank. Pain currently rated as mild after taking ibuprofen PTA. Prior to that pain was severe. He denies NV, fever, diarrhea, constipation. No dysuria, frequency, urgency.   Denies chest pain or SOB. No pain with inspiration or hemoptysis. Has had decreased PO intake secondary to pain.   Pt states he was seen at [facility] on 1/12 and was given laxative and told sxs were secondary to constipation. He was seen again yesterday at [facility] and was given antacid. He has taken these medications without relief.   He has an appt for renal US in 2 days that was scheduled by [facility]. He states his pain was too severe and he could not wait for the study to be completed.  History reviewed. No pertinent past medical history.  There are no active problems to display for this patient.   History reviewed. No pertinent surgical history.    Home Medications    Prior to Admission medications   Medication Sig Start Date End Date Taking? Authorizing Provider  ibuprofen (ADVIL,MOTRIN) 800 MG tablet Take 800 mg by mouth every 8 (eight) hours as needed for moderate pain.   Yes [provider]  pantoprazole (PROTONIX) 40 MG tablet Take 40 mg by mouth daily. 01/20/18  Yes [provider]  sodium chloride (OCEAN) 0.65 % SOLN nasal spray Place 2 sprays into both nostrils every 2 (two) hours while awake. 12/11/17 01/21/18 Yes Betancourt, Aura Fey, NP  sucralfate (CARAFATE) 1 GM/10ML suspension Take 10 mLs by mouth 4 (four) times daily. Before meals and bedtime 01/20/18  Yes  [provider]  fluticasone (FLONASE) 50 MCG/ACT nasal spray Place 1 spray into both nostrils 2 (two) times daily. Patient not taking: Reported on 01/21/2018 01/20/18 01/20/19  Olen Cordial, NP    Family History No family history on file.  Social History Social History   Tobacco Use  . Smoking status: Never Smoker  . Smokeless tobacco: Never Used  Substance Use Topics  . Alcohol use: Never    Frequency: Never  . Drug use: Never     Allergies   Patient has no known allergies.   Review of Systems Review of Systems  Constitutional: Negative for fever.  HENT: Negative for ear pain and sore throat.   Eyes: Negative for visual disturbance.  Respiratory: Negative for cough and shortness of breath.   Cardiovascular: Negative for chest pain.  Gastrointestinal: Positive for abdominal pain. Negative for constipation, diarrhea, nausea and vomiting.  Genitourinary: Negative for hematuria.  Musculoskeletal: Negative for back pain.  Skin: Negative for rash.  Neurological: Negative for headaches.  All other systems reviewed and are negative.  Physical Exam Updated Vital Signs BP 130/66   Pulse 77   Temp 98 F (36.7 C) (Oral)   Resp 18   Ht 5\' 7"  (1.702 m)   Wt 61.7 kg   SpO2 100%   BMI 21.30 kg/m   Physical Exam Vitals signs and nursing note reviewed.  Constitutional:  General: He is not in acute distress.    Appearance: He is well-developed. He is not ill-appearing or toxic-appearing.  HENT:     Head: Normocephalic and atraumatic.     Mouth/Throat:     Comments: Dry mucous membranes Eyes:     Conjunctiva/sclera: Conjunctivae normal.  Neck:     Musculoskeletal: Neck supple.  Cardiovascular:     Rate and Rhythm: Normal rate and regular rhythm.     Heart sounds: No murmur.  Pulmonary:     Effort: Pulmonary effort is normal. No respiratory distress.     Breath sounds: Normal breath sounds. No stridor. No wheezing or rhonchi.  Abdominal:      Palpations: Abdomen is soft.     Tenderness: There is no abdominal tenderness.     Comments: Mild ruq ttp without rebound, rigidity or guarding. Left CVA TTP. No right CVA TTP. No pulsatile abd mass.  Skin:    General: Skin is warm and dry.  Neurological:     Mental Status: He is alert.  Psychiatric:        Mood and Affect: Mood normal.     ED Treatments / Results  Labs (all labs ordered are listed, but only abnormal results are displayed) Labs Reviewed  COMPREHENSIVE METABOLIC PANEL - Abnormal; Notable for the following components:      Result Value   Glucose, Bld 104 (*)    Creatinine, Ser 1.42 (*)    Albumin 3.4 (*)    Total Bilirubin 1.3 (*)    GFR calc non Af Amer 52 (*)    All other components within normal limits  CBC - Abnormal; Notable for the following components:   RBC 4.18 (*)    Hemoglobin 12.9 (*)    All other components within normal limits  URINALYSIS, ROUTINE W REFLEX MICROSCOPIC - Abnormal; Notable for the following components:   APPearance HAZY (*)    Hgb urine dipstick LARGE (*)    Ketones, ur 80 (*)    Protein, ur 30 (*)    Leukocytes, UA TRACE (*)    RBC / HPF >50 (*)    All other components within normal limits  URINE CULTURE  LIPASE, BLOOD    EKG None  Radiology US Renal  Result Date: 01/21/2018 CLINICAL DATA:  Left flank pain for several days EXAM: RENAL / URINARY TRACT ULTRASOUND COMPLETE COMPARISON:  None. FINDINGS: Right Kidney: Renal measurements: 12.0 x 6.0 x 6.4 cm = volume: 239.8 mL . Echogenicity and renal cortical thickness are within normal limits. No perinephric fluid or hydronephrosis visualized. There is a cyst arising from the upper pole left kidney measuring 6.6 x 4.7 x 6.6 cm. No sonographically demonstrable calculus or ureterectasis. Left Kidney: Renal measurements: 12.2 x 5.6 x 7.1 cm = volume: 255.4 mL. Echogenicity and renal cortical thickness are within normal limits. No mass, perinephric fluid, or hydronephrosis visualized.  No sonographically demonstrable calculus or ureterectasis. Bladder: Appears normal for degree of bladder distention. Prostate measures 4.1 x 2.9 x 3.7 cm with a measured volume of 22.5 cubic cm. IMPRESSION: Cyst arising from upper pole left kidney. Study otherwise unremarkable. Electronically Signed   By: Lowella Grip III M.D.   On: 01/21/2018 08:24    Procedures Procedures (including critical care time)  Medications Ordered in ED Medications - No data to display   Initial Impression / Assessment and Plan / ED Course  I have reviewed the triage vital signs and the nursing notes.  Pertinent labs & imaging results  that were available during my care of the patient were reviewed by me and considered in my medical decision making (see chart for details).    Final Clinical Impressions(s) / ED Diagnoses   Final diagnoses:  Left flank pain  Hematuria, unspecified type   Pt presenting with LLQ and left flank pain x4 days. Pain improved with ibuprofen. No urinary or other GI sxs associated with pain. Pt nontoxic, nonseptic appearing. Afebrile.  Has mild RUQ TTP without guarding or rebound TTP. Also with left CVA TTP.  Pt declined pain medications and IVF after initial evaluation.   CBC without leukocytosis. Mild anemia.  CMP with elevated creatinine compared to 1 year ago. Normal BUN. Tbili slightly elevated at 1.3. Normal liver function and electrolytes.  Lipase normal.  UA with hematuria, ketonuria, and proteinuria. Also with trace leukocytes, >50 RBC and 11-20 WNC. No bacteria present.  Pt denies urinary sxs.   Renal US with cystic structure in left kidney, but otherwise no abnormality and no evidence of hydronephrosis.  Pt abd exam is nonsurgical at this time. Do not feel that further imaging is necessary. Pt appears stable for d/c however will likely require outpatient w/u. Contact family medicine service to set up pcp appt and they advised that pt should go to clinic today to  receive new pt information and try to set up appt. Pt informed of plan. He voices understanding. He is in agreement with plan. Advised to return of worse. All questions answered.  Pt seen in conjunction with Dr. Kathrynn Humble who is in agreement with plan.   ED Discharge Orders    None       Rodney Booze, [place] 01/21/18 2119    Varney Biles, MD 01/23/18 1620

## 2018-01-21 NOTE — Discharge Instructions (Addendum)
Please go directly [facility] to be enrolled as a new patient.  Please follow up to have your kidney function rechecked and to be re-evaluated in one week.   Please return to the ER sooner if you have any new or worsening symptoms, or if you have any of the following symptoms:  Abdominal pain that does not go away.  You have a fever.  You keep throwing up (vomiting).  The pain is felt only in portions of the abdomen. Pain in the right side could possibly be appendicitis. In an adult, pain in the left lower portion of the abdomen could be colitis or diverticulitis.  You pass bloody or black tarry stools.  There is bright red blood in the stool.  The constipation stays for more than 4 days.  There is belly (abdominal) or rectal pain.  You do not seem to be getting better.  You have any questions or concerns.

## 2018-01-21 NOTE — ED Triage Notes (Signed)
C/o left lower abd. Pain with radiation to left flank, onset Sunday went to [facility]  Was seen pain eased, yest pain returned and he was seen at [facility] again and was told prob. Acid reflux. States last pm returned and it was worse. States he took motrin without relief

## 2018-01-21 NOTE — ED Notes (Signed)
Patient transported to [facility] 

## 2018-01-22 LAB — URINE CULTURE: Culture: NO GROWTH

## 2018-01-23 ENCOUNTER — Encounter: Payer: Self-pay | Admitting: Family Medicine

## 2018-01-23 ENCOUNTER — Ambulatory Visit: Payer: PRIVATE HEALTH INSURANCE | Admitting: Family Medicine

## 2018-01-23 ENCOUNTER — Other Ambulatory Visit: Payer: Self-pay

## 2018-01-23 VITALS — BP 148/82 | HR 81 | Temp 98.4°F | Ht 67.0 in | Wt 136.0 lb

## 2018-01-23 DIAGNOSIS — Z87448 Personal history of other diseases of urinary system: Secondary | ICD-10-CM

## 2018-01-23 DIAGNOSIS — R1032 Left lower quadrant pain: Secondary | ICD-10-CM

## 2018-01-23 DIAGNOSIS — Z0000 Encounter for general adult medical examination without abnormal findings: Secondary | ICD-10-CM

## 2018-01-23 DIAGNOSIS — R030 Elevated blood-pressure reading, without diagnosis of hypertension: Secondary | ICD-10-CM | POA: Diagnosis not present

## 2018-01-23 LAB — POCT URINALYSIS DIP (MANUAL ENTRY)
Bilirubin, UA: NEGATIVE
Glucose, UA: NEGATIVE mg/dL
Ketones, POC UA: NEGATIVE mg/dL
Nitrite, UA: NEGATIVE
Protein Ur, POC: NEGATIVE mg/dL
Spec Grav, UA: 1.015 (ref 1.010–1.025)
UROBILINOGEN UA: 0.2 U/dL
pH, UA: 7 (ref 5.0–8.0)

## 2018-01-23 LAB — POCT UA - MICROSCOPIC ONLY: RBC, urine, microscopic: >20

## 2018-01-23 MED ORDER — TRAMADOL HCL 50 MG PO TABS: 50.0000 mg | ORAL_TABLET | Freq: Four times a day (QID) | ORAL | 0 refills | Status: AC | PRN

## 2018-01-23 MED ORDER — TAMSULOSIN HCL 0.4 MG PO CAPS: 0.4000 mg | ORAL_CAPSULE | Freq: Every day | ORAL | 0 refills | Status: DC

## 2018-01-23 NOTE — Progress Notes (Signed)
Subjective:    Patient ID: Brian Long, male    DOB: [birth year], 63 y.o.   MRN: [id number]   CC: new patient, abdominal pain   HPI: Mr. Dengel is a 63 year old gentleman presenting to establish care and discuss the following:  Abdominal pain: Recently seen in the ED on 1/15 for sudden onset left lower quadrant flank pain that have been present for about 4 days at that time.  Renal ultrasound was performed, showing no hydronephrosis or stone, only a renal cyst on the left.  Labs were significant for an increase in creatinine to 1.42, from 0.9 a year ago.  Also had a UA with> 50 RBC, and pyuria.  He states this LLQ abdominal pain that radiates to his flank is still present, pushing-like quality, constant.  He feels like the pain gets worse 20 minutes after eating and also when he lays down/make certain movements.  Area of pain has not migrated since starting.  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  He has been taking ibuprofen 800 mg 3 times daily with relief, heating pad without relief. Tried an antacid for a day, no change. Heat pad no relief, ibuprofen helps-lay down its worse. When eats, worse- 20 minutes.  Bowels moving well, 1 normal BM a day.  Normal appetite, no changes in diet.  He denies any associated nausea, vomiting, change in bowel movements, hematochezia, melena, dysuria, urinary frequency/dribbling, fever, unexplained weight loss, or heartburn.  Last had a colonoscopy approximately 20 years ago.  Does not drink frequently.    Smoking status reviewed-non-smoker.  Review of Systems Per HPI, also denies recent illness, fever, headache, changes in vision, chest pain, shortness of breath, abdominal pain, N/V/D, weakness    Objective:  BP (!) 148/82   Pulse 81   Temp 98.4 F (36.9 C) (Oral)   Ht 5\' 7"  (1.702 m)   Wt 136 lb (61.7 kg)   SpO2 98%   BMI 21.30 kg/m  Vitals and nursing note reviewed  General: NAD, pleasant Cardiac:  RRR, normal heart sounds, no murmurs Respiratory: CTAB, normal effort Abdomen: soft, nondistended, normoactive bowel sounds, quite tender to palpation of his left lower quadrant, without rebounding or guarding.  No CVA tenderness bilaterally. Extremities: no edema or cyanosis. WWP. Skin: warm and dry, no rashes noted Neuro: alert and oriented, no focal deficits Psych: normal affect  Assessment & Plan:    Abdominal pain, left lower quadrant Acute, sudden onset 1 week ago without change in quality/frequency.  Differential may remains broad, most likely differential including kidney stone vs diverticulitis vs MSK.  Considering kidney stone as patient presents with lower quadrant pain that radiates to his flank with associated pyuria and hematuria (still present on today's U/A), however atypical pain quality and description of constant pain rather than episodic.  Fortunately no hydronephrosis on renal U/S on 1/15, and symptoms have not worsened since that time.  Could also consider diverticulitis with left lower quadrant pain associated with meals, but less likely without associated fever, change in bowel movements.  Lastly, MSK possible with change in pain with movements. Low concern for pyelo/UTI without CVA tenderness, fever, or urinary sx. Will treat as a kidney stone at this time, strict return precautions were discussed.  - Obtain BMP to monitor kidney function, elevated on 1/15 - Prescribe tramadol 50 mg every 6-8 hours as needed, provided a 3-day supply.  Would like patient to avoid ibuprofen at this time given acute kidney injury -Prescribed  Flomax daily - Encourage patient to stay well-hydrated -Referral to GI for screening colonoscopy - Follow-up on abdominal pain in the next few days, or sooner as needed   Elevated blood-pressure reading without diagnosis of hypertension BP 148/82 in the office today.  SBP 130 on 1/15 in the ED.  Suspect may be secondary to pain, no diagnosed history of  hypertension.  Will continue to monitor.  Healthcare maintenance Obtain HIV and hep C screening today.  Declines flu/Tdap shots at this time.  We will also put in an ambulatory referral to GI for a routine screening colonoscopy as stated above.   Darrelyn Hillock, DO  Family Medicine Resident PGY-1

## 2018-01-23 NOTE — Patient Instructions (Addendum)
It was a wonderful meeting you today!  For your abdominal pain, I have prescribed a pain medication called tramadol, you may take this every 6 hours as needed.  I have also prescribed Flomax, this is to help a kidney stone pass if it is present.  Make sure you continue to drink plenty of water.  If your abdominal pain becomes severe, nauseous with vomiting, or fever, please return to care sooner.  Lastly, we obtain some labs today, I will let you know those results via phone or mail in the next week.  I would like to see you back in the next few days to see how your abdominal pain is doing.

## 2018-01-24 DIAGNOSIS — Z0000 Encounter for general adult medical examination without abnormal findings: Secondary | ICD-10-CM | POA: Insufficient documentation

## 2018-01-24 DIAGNOSIS — R030 Elevated blood-pressure reading, without diagnosis of hypertension: Secondary | ICD-10-CM | POA: Insufficient documentation

## 2018-01-24 DIAGNOSIS — R1032 Left lower quadrant pain: Secondary | ICD-10-CM | POA: Insufficient documentation

## 2018-01-24 LAB — BASIC METABOLIC PANEL
BUN/Creatinine Ratio: 13 (ref 10–24)
BUN: 11 mg/dL (ref 8–27)
CO2: 26 mmol/L (ref 20–29)
Calcium: 9 mg/dL (ref 8.6–10.2)
Chloride: 103 mmol/L (ref 96–106)
Creatinine, Ser: 0.85 mg/dL (ref 0.76–1.27)
GFR calc Af Amer: 106 mL/min/{1.73_m2} (ref >59)
GFR calc non Af Amer: 92 mL/min/{1.73_m2} (ref >59)
Glucose: 93 mg/dL (ref 65–99)
Potassium: 4.1 mmol/L (ref 3.5–5.2)
Sodium: 142 mmol/L (ref 134–144)

## 2018-01-24 LAB — HEPATITIS C ANTIBODY: HEP C VIRUS AB: 0.1 {s_co_ratio} (ref 0.0–0.9)

## 2018-01-24 LAB — HIV ANTIBODY (ROUTINE TESTING W REFLEX): HIV Screen 4th Generation wRfx: NONREACTIVE

## 2018-01-24 NOTE — Assessment & Plan Note (Signed)
BP 148/82 in the office today.  SBP 130 on 1/15 in the ED.  Suspect may be secondary to pain, no diagnosed history of hypertension.  Will continue to monitor.

## 2018-01-24 NOTE — Assessment & Plan Note (Addendum)
Acute, sudden onset 1 week ago without change in quality/frequency.  Differential may remains broad, most likely differential including kidney stone vs diverticulitis vs MSK.  Considering kidney stone as patient presents with lower quadrant pain that radiates to his flank with associated pyuria and hematuria (still present on today's U/A), however atypical pain quality and description of constant pain rather than episodic.  Fortunately no hydronephrosis on renal U/S on 1/15, and symptoms have not worsened since that time.  Could also consider diverticulitis with left lower quadrant pain associated with meals, but less likely without associated fever, change in bowel movements.  Lastly, MSK possible with change in pain with movements. Low concern for pyelo/UTI without CVA tenderness, fever, or urinary sx. Will treat as a kidney stone at this time, strict return precautions were discussed.  - Obtain BMP to monitor kidney function, elevated on 1/15 - Prescribe tramadol 50 mg every 6-8 hours as needed, provided a 3-day supply.  Would like patient to avoid ibuprofen at this time given acute kidney injury -Prescribed Flomax daily - Encourage patient to stay well-hydrated -Referral to GI for screening colonoscopy - Follow-up on abdominal pain in the next few days, or sooner as needed

## 2018-01-24 NOTE — Assessment & Plan Note (Signed)
Obtain HIV and hep C screening today.  Declines flu/Tdap shots at this time.  We will also put in an ambulatory referral to GI for a routine screening colonoscopy as stated above.

## 2018-01-27 ENCOUNTER — Ambulatory Visit (INDEPENDENT_AMBULATORY_CARE_PROVIDER_SITE_OTHER): Payer: PRIVATE HEALTH INSURANCE | Admitting: Family Medicine

## 2018-01-27 ENCOUNTER — Other Ambulatory Visit: Payer: Self-pay

## 2018-01-27 ENCOUNTER — Encounter: Payer: Self-pay | Admitting: Family Medicine

## 2018-01-27 VITALS — BP 122/66 | HR 65 | Temp 98.3°F | Ht 67.0 in | Wt 135.4 lb

## 2018-01-27 DIAGNOSIS — R1032 Left lower quadrant pain: Secondary | ICD-10-CM | POA: Diagnosis not present

## 2018-01-27 DIAGNOSIS — N200 Calculus of kidney: Secondary | ICD-10-CM

## 2018-01-27 LAB — POCT URINALYSIS DIP (MANUAL ENTRY)
Bilirubin, UA: NEGATIVE
Blood, UA: NEGATIVE
Glucose, UA: NEGATIVE mg/dL
Ketones, POC UA: NEGATIVE mg/dL
Leukocytes, UA: NEGATIVE
Nitrite, UA: NEGATIVE
PH UA: 7.5 (ref 5.0–8.0)
Protein Ur, POC: NEGATIVE mg/dL
Spec Grav, UA: 1.01 (ref 1.010–1.025)
Urobilinogen, UA: 0.2 E.U./dL

## 2018-01-27 NOTE — Patient Instructions (Signed)

## 2018-01-27 NOTE — Progress Notes (Signed)
Letter sent for results. Creatinine WNL.

## 2018-01-27 NOTE — Progress Notes (Signed)
     Subjective: Chief Complaint  Patient presents with  . LLQ pain    HPI: Brian Long is a 63 y.o. presenting to clinic today to discuss the following:  Left Sided Flank Pain Patient returns for follow up after being seen in the office a few days ago for new onset left sided flank and abdominal pain. He was also having pain on urination and he had never experienced these symptoms before. He was worked up for possible UTI and his U/A was negative and he had no fever or leukocytosis. He was given Flomax, told to drink lots of water, and was given Tramadol for the pain. He states today he feels much, much better. The pain is completely gone and he has returned to work in full capacity and he is not having any difficulty. He does not remember seeing a stone pass in his urine. He only needed a few tramadol pills.   ROS noted in HPI.   Past Medical, Surgical, Social, and Family History Reviewed & Updated per EMR.   Pertinent Historical Findings include:   Social History   Tobacco Use  Smoking Status Never Smoker  Smokeless Tobacco Never Used   Objective: BP 122/66   Pulse 65   Temp 98.3 F (36.8 C) (Oral)   Ht 5\' 7"  (1.702 m)   Wt 135 lb 6.4 oz (61.4 kg)   SpO2 98%   BMI 21.21 kg/m  Vitals and nursing notes reviewed  Physical Exam Gen: Alert and Oriented x 3, NAD HEENT: Normocephalic, atraumatic, PERRLA, EOM CV: RRR, no murmurs, normal S1, S2 split Resp: CTAB, no wheezing, rales, or rhonchi, comfortable work of breathing Abd: non-distended, non-tender, soft, +bs in all four quadrants, no Costvertebral angle tenderness Ext: no clubbing, cyanosis, or edema Skin: warm, dry, intact, no rashes  Results for orders placed or performed in visit on 01/27/18 (from the past 72 hour(s))  POCT urinalysis dipstick     Status: None   Collection Time: 01/27/18 11:45 AM  Result Value Ref Range   Color, UA yellow yellow   Clarity, UA clear clear   Glucose, UA negative negative  mg/dL   Bilirubin, UA negative negative   Ketones, POC UA negative negative mg/dL   Spec Grav, UA 1.010 1.010 - 1.025   Blood, UA negative negative   pH, UA 7.5 5.0 - 8.0   Protein Ur, POC negative negative mg/dL   Urobilinogen, UA 0.2 0.2 or 1.0 E.U./dL   Nitrite, UA Negative Negative   Leukocytes, UA Negative Negative    Assessment/Plan:  Nephrolithiasis Most likely he passed a small kidney stone as patient has greatly improved with water and flomax and no longer requires medication as his pain is resolved. Discussed the importance of continuing to drink plenty of water but can stop flomax.  - If occurs again consider getting a strainer to use at home to catch stone so it can be analyzed. Follow up as needed  PATIENT EDUCATION PROVIDED: See AVS    Diagnosis and plan along with any newly prescribed medication(s) were discussed in detail with this patient today. The patient verbalized understanding and agreed with the plan. Patient advised if symptoms worsen return to clinic or ER.   Health Maintainance:   Orders Placed This Encounter  Procedures  . POCT urinalysis dipstick    No orders of the defined types were placed in this encounter.    Harolyn Rutherford, DO 01/27/2018, 11:47 AM PGY-2 [place]

## 2018-01-29 ENCOUNTER — Ambulatory Visit: Payer: Self-pay | Admitting: *Deleted

## 2018-01-29 VITALS — BP 136/76 | HR 73 | Ht 66.5 in | Wt 130.0 lb

## 2018-01-29 DIAGNOSIS — N200 Calculus of kidney: Secondary | ICD-10-CM | POA: Insufficient documentation

## 2018-01-29 DIAGNOSIS — Z0000 Encounter for general adult medical examination without abnormal findings: Secondary | ICD-10-CM

## 2018-01-29 NOTE — Assessment & Plan Note (Signed)
Most likely he passed a small kidney stone as patient has greatly improved with water and flomax and no longer requires medication as his pain is resolved. Discussed the importance of continuing to drink plenty of water but can stop flomax.  - If occurs again consider getting a strainer to use at home to catch stone so it can be analyzed.

## 2018-01-29 NOTE — Progress Notes (Signed)
[employer] insurance premium discount evaluation: Labs Drawn. Replacements ROI form signed. Tobacco Free Attestation form signed.  Forms placed in paper chart. Okay to route results to [employer], DO.

## 2018-01-30 LAB — CMP12+LP+TP+TSH+6AC+PSA+CBC…
A/G RATIO: 1.6 (ref 1.2–2.2)
ALT: 22 IU/L (ref 0–44)
AST: 24 IU/L (ref 0–40)
Albumin: 4.2 g/dL (ref 3.8–4.8)
Alkaline Phosphatase: 75 IU/L (ref 39–117)
BUN/Creatinine Ratio: 17 (ref 10–24)
BUN: 15 mg/dL (ref 8–27)
Basophils Absolute: 0 10*3/uL (ref 0.0–0.2)
Basos: 1 %
Bilirubin Total: 0.4 mg/dL (ref 0.0–1.2)
CHOLESTEROL TOTAL: 169 mg/dL (ref 100–199)
Calcium: 9.2 mg/dL (ref 8.6–10.2)
Chloride: 101 mmol/L (ref 96–106)
Chol/HDL Ratio: 3.6 ratio (ref 0.0–5.0)
Creatinine, Ser: 0.89 mg/dL (ref 0.76–1.27)
EOS (ABSOLUTE): 0.1 10*3/uL (ref 0.0–0.4)
Eos: 2 %
Estimated CHD Risk: 0.6 times avg. (ref 0.0–1.0)
FREE THYROXINE INDEX: 1.8 (ref 1.2–4.9)
GFR calc non Af Amer: 90 mL/min/{1.73_m2} (ref >59)
GFR, EST AFRICAN AMERICAN: 104 mL/min/{1.73_m2} (ref >59)
GGT: 8 IU/L (ref 0–65)
Globulin, Total: 2.7 g/dL (ref 1.5–4.5)
Glucose: 89 mg/dL (ref 65–99)
HDL: 47 mg/dL (ref >39)
Hematocrit: 38.9 % (ref 37.5–51.0)
Hemoglobin: 13.3 g/dL (ref 13.0–17.7)
Immature Grans (Abs): 0 10*3/uL (ref 0.0–0.1)
Immature Granulocytes: 0 %
Iron: 99 ug/dL (ref 38–169)
LDH: 185 IU/L (ref 121–224)
LDL CALC: 110 mg/dL — AB (ref 0–99)
Lymphocytes Absolute: 1.3 10*3/uL (ref 0.7–3.1)
Lymphs: 34 %
MCH: 31.4 pg (ref 26.6–33.0)
MCHC: 34.2 g/dL (ref 31.5–35.7)
MCV: 92 fL (ref 79–97)
Monocytes Absolute: 0.3 10*3/uL (ref 0.1–0.9)
Monocytes: 8 %
Neutrophils Absolute: 2.2 10*3/uL (ref 1.4–7.0)
Neutrophils: 55 %
PLATELETS: 383 10*3/uL (ref 150–450)
Phosphorus: 2.4 mg/dL — ABNORMAL LOW (ref 2.8–4.1)
Potassium: 4 mmol/L (ref 3.5–5.2)
Prostate Specific Ag, Serum: 1.1 ng/mL (ref 0.0–4.0)
RBC: 4.23 x10E6/uL (ref 4.14–5.80)
RDW: 12.1 % (ref 11.6–15.4)
Sodium: 141 mmol/L (ref 134–144)
T3 UPTAKE RATIO: 26 % (ref 24–39)
T4, Total: 7.1 ug/dL (ref 4.5–12.0)
TSH: 1.17 u[IU]/mL (ref 0.450–4.500)
Total Protein: 6.9 g/dL (ref 6.0–8.5)
Triglycerides: 60 mg/dL (ref 0–149)
Uric Acid: 4.9 mg/dL (ref 3.7–8.6)
VLDL Cholesterol Cal: 12 mg/dL (ref 5–40)
WBC: 4 10*3/uL (ref 3.4–10.8)

## 2018-01-30 LAB — HGB A1C W/O EAG: Hgb A1c MFr Bld: 5.9 % — ABNORMAL HIGH (ref 4.8–5.6)

## 2018-02-02 NOTE — Progress Notes (Signed)
noted 

## 2018-02-11 ENCOUNTER — Encounter: Payer: Self-pay | Admitting: Family Medicine

## 2018-02-11 DIAGNOSIS — R7303 Prediabetes: Secondary | ICD-10-CM | POA: Insufficient documentation

## 2018-02-14 ENCOUNTER — Other Ambulatory Visit: Payer: Self-pay | Admitting: Family Medicine

## 2018-02-14 DIAGNOSIS — R1032 Left lower quadrant pain: Secondary | ICD-10-CM

## 2018-03-03 ENCOUNTER — Encounter: Payer: Self-pay | Admitting: Family Medicine

## 2019-04-10 ENCOUNTER — Ambulatory Visit: Payer: No Typology Code available for payment source | Attending: Internal Medicine

## 2019-04-10 DIAGNOSIS — Z23 Encounter for immunization: Secondary | ICD-10-CM

## 2019-04-10 NOTE — Progress Notes (Signed)
   Covid-19 Vaccination Clinic  Name:  Brian Long    MRN: [id number] DOB: 02-May-1955  04/10/2019  [name] was observed post Covid-19 immunization for 15 minutes without incident. He was provided with Vaccine Information Sheet and instruction to access the V-Safe system.   [name] was instructed to call 911 with any severe reactions post vaccine: Marland Kitchen Difficulty breathing  . Swelling of face and throat  . A fast heartbeat  . A bad rash all over body  . Dizziness and weakness   Immunizations Administered    Name Date Dose VIS Date Route   Pfizer COVID-19 Vaccine 04/10/2019 11:51 AM 0.3 mL 12/18/2018 Intramuscular   Manufacturer: [place]   Lot: [id number]   [place]: [id number]

## 2019-05-05 ENCOUNTER — Ambulatory Visit: Payer: No Typology Code available for payment source | Attending: Internal Medicine

## 2019-05-05 DIAGNOSIS — Z23 Encounter for immunization: Secondary | ICD-10-CM

## 2019-05-05 NOTE — Progress Notes (Signed)
   Covid-19 Vaccination Clinic  Name:  Loukas Cruzen    MRN: [id number] DOB: March 29, 1955  05/05/2019  Mr. Heckel was observed post Covid-19 immunization for 15 minutes without incident. He was provided with Vaccine Information Sheet and instruction to access the V-Safe system.   Mr. Hatchel was instructed to call 911 with any severe reactions post vaccine: Marland Kitchen Difficulty breathing  . Swelling of face and throat  . A fast heartbeat  . A bad rash all over body  . Dizziness and weakness   Immunizations Administered    Name Date Dose VIS Date Route   Pfizer COVID-19 Vaccine 05/05/2019  3:40 PM 0.3 mL 03/03/2018 Intramuscular   Manufacturer: [place]   Lot: [id number]   [place]: [id number]

## 2019-07-05 ENCOUNTER — Encounter: Payer: Self-pay | Admitting: Internal Medicine

## 2019-08-03 ENCOUNTER — Ambulatory Visit (AMBULATORY_SURGERY_CENTER): Payer: Self-pay

## 2019-08-03 ENCOUNTER — Other Ambulatory Visit: Payer: Self-pay

## 2019-08-03 VITALS — Ht 66.5 in | Wt 139.0 lb

## 2019-08-03 DIAGNOSIS — Z1211 Encounter for screening for malignant neoplasm of colon: Secondary | ICD-10-CM

## 2019-08-03 MED ORDER — SUTAB 1479-225-188 MG PO TABS: 12.0000 | ORAL_TABLET | ORAL | 0 refills | Status: DC

## 2019-08-03 NOTE — Progress Notes (Signed)
No egg or soy allergy known to patient  No issues with past sedation with any surgeries or procedures no intubation problems in the past  No diet pills per patient No home 02 use per patient  No blood thinners per patient  Pt denies issues with constipation  No A fib or A flutter  EMMI video to pt or MyChart  COVID 19 guidelines implemented in [place] today   sutab code and Coupon given to pt in [facility] today    Pt has been vaccinated for covid.  Due to the COVID-19 pandemic we are asking patients to follow these guidelines. Please only bring one care partner. Please be aware that your care partner may wait in the car in the parking lot or if they feel like they will be too hot to wait in the car, they may wait in the lobby on the 4th floor. All care partners are required to wear a mask the entire time (we do not have any that we can provide them), they need to practice social distancing, and we will do a Covid check for all patient's and care partners when you arrive. Also we will check their temperature and your temperature. If the care partner waits in their car they need to stay in the parking lot the entire time and we will call them on their cell phone when the patient is ready for discharge so they can bring the car to the front of the building. Also all patient's will need to wear a mask into building.

## 2019-08-16 ENCOUNTER — Other Ambulatory Visit: Payer: Self-pay

## 2019-08-16 ENCOUNTER — Ambulatory Visit (AMBULATORY_SURGERY_CENTER): Payer: PRIVATE HEALTH INSURANCE | Admitting: Internal Medicine

## 2019-08-16 ENCOUNTER — Encounter: Payer: Self-pay | Admitting: Internal Medicine

## 2019-08-16 VITALS — BP 123/66 | HR 61 | Temp 98.4°F | Resp 10 | Ht 66.5 in | Wt 139.0 lb

## 2019-08-16 DIAGNOSIS — K635 Polyp of colon: Secondary | ICD-10-CM | POA: Diagnosis not present

## 2019-08-16 DIAGNOSIS — D124 Benign neoplasm of descending colon: Secondary | ICD-10-CM | POA: Diagnosis not present

## 2019-08-16 DIAGNOSIS — K6389 Other specified diseases of intestine: Secondary | ICD-10-CM

## 2019-08-16 DIAGNOSIS — Z1211 Encounter for screening for malignant neoplasm of colon: Secondary | ICD-10-CM | POA: Diagnosis not present

## 2019-08-16 DIAGNOSIS — D122 Benign neoplasm of ascending colon: Secondary | ICD-10-CM

## 2019-08-16 MED ORDER — SODIUM CHLORIDE 0.9 % IV SOLN: 500.0000 mL | Freq: Once | INTRAVENOUS | Status: DC

## 2019-08-16 NOTE — Op Note (Signed)
[facility] Patient Name: Brian Long Procedure Date: 08/16/2019 2:18 PM MRN: [id number] Endoscopist: Docia Chuck. Henrene Pastor , MD Age: 64 Referring MD:  Date of Birth: [birth year] Gender: Male Account #: [id number] Procedure:                Colonoscopy with cold snare polypectomy x 2 Indications:              Screening for colorectal malignant neoplasm Medicines:                Monitored Anesthesia Care Procedure:                Pre-Anesthesia Assessment:                           - Prior to the procedure, a History and Physical                            was performed, and patient medications and                            allergies were reviewed. The patient's tolerance of                            previous anesthesia was also reviewed. The risks                            and benefits of the procedure and the sedation                            options and risks were discussed with the patient.                            All questions were answered, and informed consent                            was obtained. Prior Anticoagulants: The patient has                            taken no previous anticoagulant or antiplatelet                            agents. ASA Grade Assessment: II - A patient with                            mild systemic disease. After reviewing the risks                            and benefits, the patient was deemed in                            satisfactory condition to undergo the procedure.                           After obtaining informed consent, the colonoscope  was passed under direct vision. Throughout the                            procedure, the patient's blood pressure, pulse, and                            oxygen saturations were monitored continuously. The                            Colonoscope was introduced through the anus and                            advanced to the the cecum, identified by                             appendiceal orifice and ileocecal valve. The                            ileocecal valve, appendiceal orifice, and rectum                            were photographed. The quality of the bowel                            preparation was excellent. The colonoscopy was                            performed without difficulty. The patient tolerated                            the procedure well. The bowel preparation used was                            SUPREP via split dose instruction. Scope In: 2:27:04 PM Scope Out: 2:42:07 PM Scope Withdrawal Time: 0 hours 11 minutes 57 seconds  Total Procedure Duration: 0 hours 15 minutes 3 seconds  Findings:                 Two polyps were found in the descending colon and                            ascending colon. The polyps were 1 to 2 mm in size.                            These polyps were removed with a cold snare.                            Resection and retrieval were complete.                           Internal hemorrhoids were found during                            retroflexion. The hemorrhoids were moderate.  The exam was otherwise without abnormality on                            direct and retroflexion views. Complications:            No immediate complications. Estimated blood loss:                            None. Estimated Blood Loss:     Estimated blood loss: none. Impression:               - Two 1 to 2 mm polyps in the descending colon and                            in the ascending colon, removed with a cold snare.                            Resected and retrieved.                           - Internal hemorrhoids.                           - The examination was otherwise normal on direct                            and retroflexion views. Recommendation:           - Repeat colonoscopy in 7-10 years for surveillance.                           - Patient has a contact number available for                             emergencies. The signs and symptoms of potential                            delayed complications were discussed with the                            patient. Return to normal activities tomorrow.                            Written discharge instructions were provided to the                            patient.                           - Resume previous diet.                           - Continue present medications.                           - Await pathology results. Docia Chuck. Henrene Pastor, MD 08/16/2019 2:58:45 PM This report has been signed electronically.

## 2019-08-16 NOTE — Progress Notes (Signed)
LR - Check-in CW/TB - VS  Pt's states no medical or surgical changes since previsit or office visit. maw

## 2019-08-16 NOTE — Progress Notes (Signed)
Called to room to assist during endoscopic procedure.  Patient ID and intended procedure confirmed with present staff. Received instructions for my participation in the procedure from the performing physician.  

## 2019-08-16 NOTE — Progress Notes (Signed)
PT taken to PACU. Monitors in place. VSS. Report given to RN. 

## 2019-08-16 NOTE — Patient Instructions (Addendum)
Read all of the handouts given to you by your recovery room nurse.  Thank-you for choosing us for your healthcare needs today.  YOU HAD AN ENDOSCOPIC PROCEDURE TODAY AT THE [facility]:   Refer to the procedure report that was given to you for any specific questions about what was found during the examination.  If the procedure report does not answer your questions, please call your gastroenterologist to clarify.  If you requested that your care partner not be given the details of your procedure findings, then the procedure report has been included in a sealed envelope for you to review at your convenience later.  YOU SHOULD EXPECT: Some feelings of bloating in the abdomen. Passage of more gas than usual.  Walking can help get rid of the air that was put into your GI tract during the procedure and reduce the bloating. If you had a lower endoscopy (such as a colonoscopy or flexible sigmoidoscopy) you may notice spotting of blood in your stool or on the toilet paper. If you underwent a bowel prep for your procedure, you may not have a normal bowel movement for a few days.  Please Note:  You might notice some irritation and congestion in your nose or some drainage.  This is from the oxygen used during your procedure.  There is no need for concern and it should clear up in a day or so.  SYMPTOMS TO REPORT IMMEDIATELY:   Following lower endoscopy (colonoscopy or flexible sigmoidoscopy):  Excessive amounts of blood in the stool  Significant tenderness or worsening of abdominal pains  Swelling of the abdomen that is new, acute  Fever of 100F or higher   Black, tarry-looking stools  For urgent or emergent issues, a gastroenterologist can be reached at any hour by calling [phone]. Do not use MyChart messaging for urgent concerns.    DIET:  We do recommend a small meal at first, but then you may proceed to your regular diet.  Drink plenty of fluids but you should avoid alcoholic  beverages for 24 hours.  ACTIVITY:  You should plan to take it easy for the rest of today and you should NOT DRIVE or use heavy machinery until tomorrow (because of the sedation medicines used during the test).    FOLLOW UP: Our staff will call the number listed on your records 48-72 hours following your procedure to check on you and address any questions or concerns that you may have regarding the information given to you following your procedure. If we do not reach you, we will leave a message.  We will attempt to reach you two times.  During this call, we will ask if you have developed any symptoms of COVID 19. If you develop any symptoms (ie: fever, flu-like symptoms, shortness of breath, cough etc.) before then, please call [phone].  If you test positive for Covid 19 in the 2 weeks post procedure, please call and report this information to us.    If any biopsies were taken you will be contacted by phone or by letter within the next 1-3 weeks.  Please call us at [phone] if you have not heard about the biopsies in 3 weeks.    SIGNATURES/CONFIDENTIALITY: You and/or your care partner have signed paperwork which will be entered into your electronic medical record.  These signatures attest to the fact that that the information above on your After Visit Summary has been reviewed and is understood.  Full responsibility of the confidentiality   of this discharge information lies with you and/or your care-partner.

## 2019-08-18 ENCOUNTER — Telehealth: Payer: Self-pay | Admitting: *Deleted

## 2019-08-18 NOTE — Telephone Encounter (Signed)
First follow up call attempt.  Left message on vm.

## 2019-08-18 NOTE — Telephone Encounter (Signed)
  Follow up Call-  Call back number 08/16/2019  Post procedure Call Back phone  # [phone] cell  Permission to leave phone message Yes  Some recent data might be hidden     Patient questions:  Do you have a fever, pain , or abdominal swelling? No. Pain Score  0 *  Have you tolerated food without any problems? Yes.    Have you been able to return to your normal activities? Yes.    Do you have any questions about your discharge instructions: Diet   No. Medications  No. Follow up visit  No.  Do you have questions or concerns about your Care? No.  Actions: * If pain score is 4 or above: No action needed, pain <4.   1. Have you developed a fever since your procedure?no  2.   Have you had an respiratory symptoms (SOB or cough) since your procedure? no  3.   Have you tested positive for COVID 19 since your procedure no  4.   Have you had any family members/close contacts diagnosed with the COVID 19 since your procedure?  no   If yes to any of these questions please route to Joylene John, RN and Joella Prince, RN

## 2019-08-19 ENCOUNTER — Encounter: Payer: Self-pay | Admitting: Internal Medicine

## 2019-08-20 ENCOUNTER — Other Ambulatory Visit: Payer: Self-pay

## 2019-08-20 ENCOUNTER — Ambulatory Visit: Payer: Self-pay | Admitting: *Deleted

## 2019-08-20 DIAGNOSIS — Z23 Encounter for immunization: Secondary | ICD-10-CM

## 2019-08-20 NOTE — Progress Notes (Signed)
Shingrix 1st dose administered. Appt made for 3 months for dose #2.

## 2019-09-12 IMAGING — US US RENAL
1 series · 14 of 25 positions shown · non-contrast
Comparison: None.

CLINICAL DATA: Left flank pain for several days

EXAM:
RENAL / URINARY TRACT ULTRASOUND COMPLETE

[Series 1: us renal · 0.23mm/px · 14 of 52 slices shown]
[im 1/52]
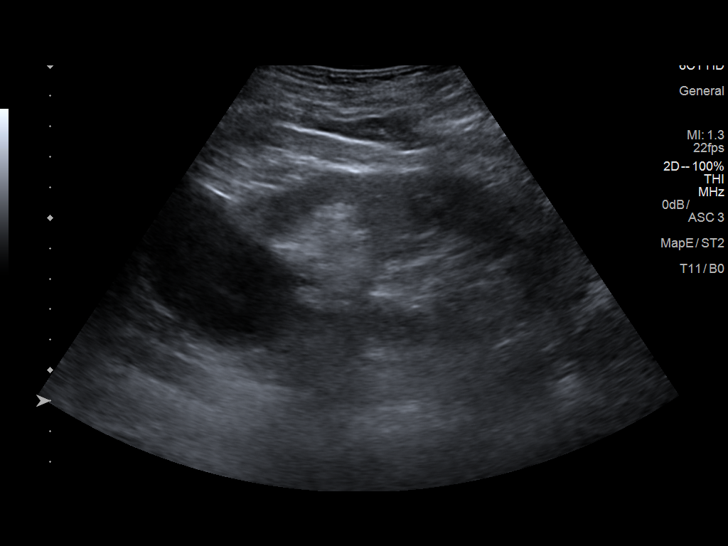
[im 5/52]
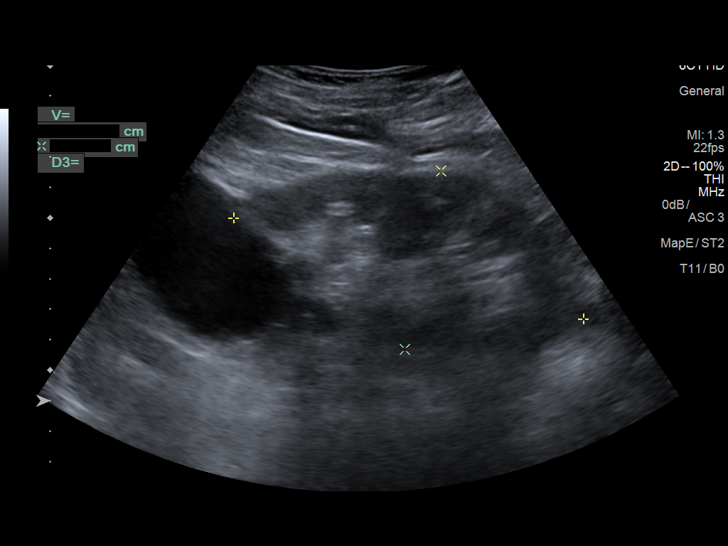
[im 9/52]
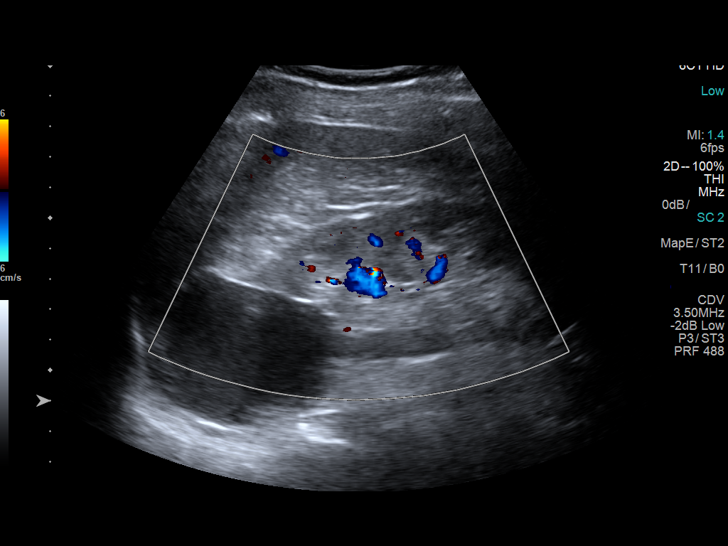
[im 13/52]
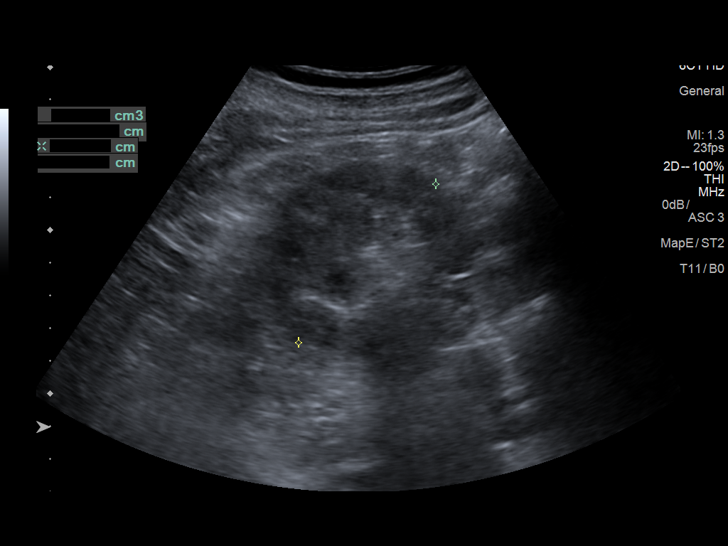
[im 18/52]
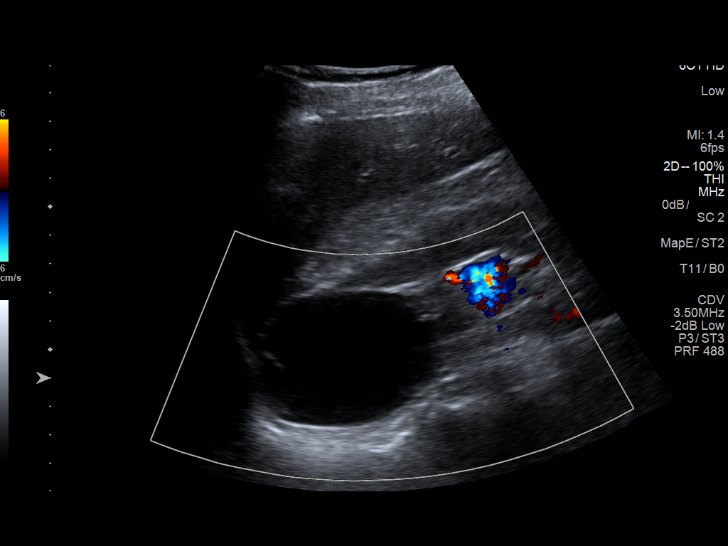
[im 20/52]
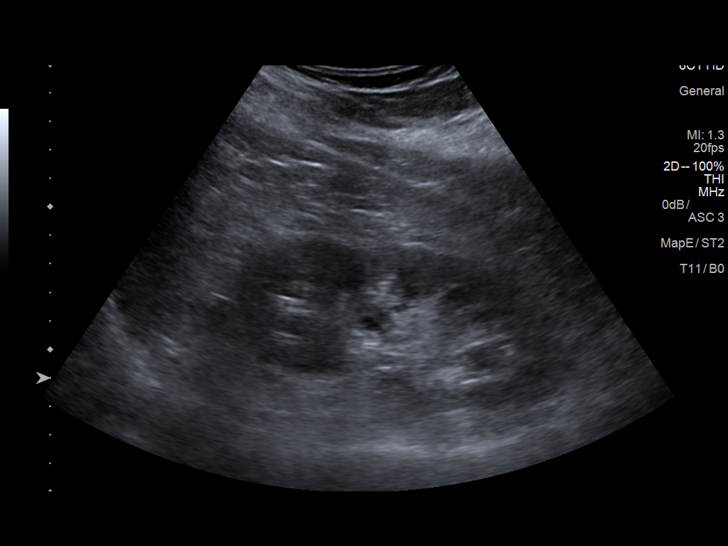
[im 24/52]
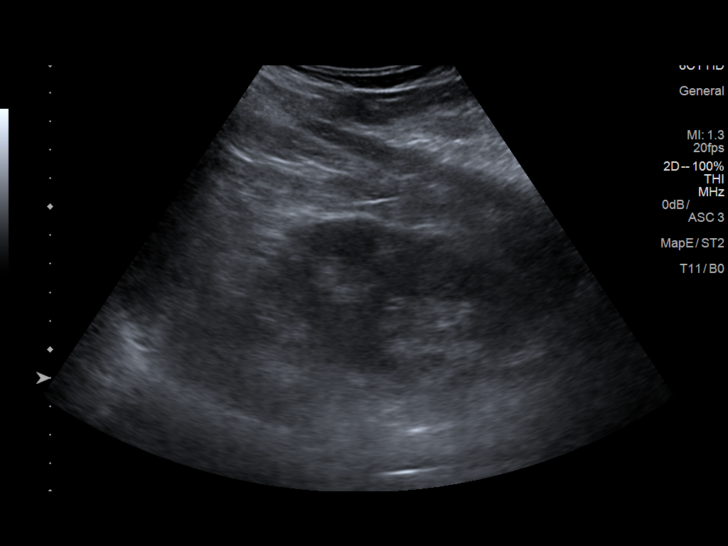
[im 28/52]
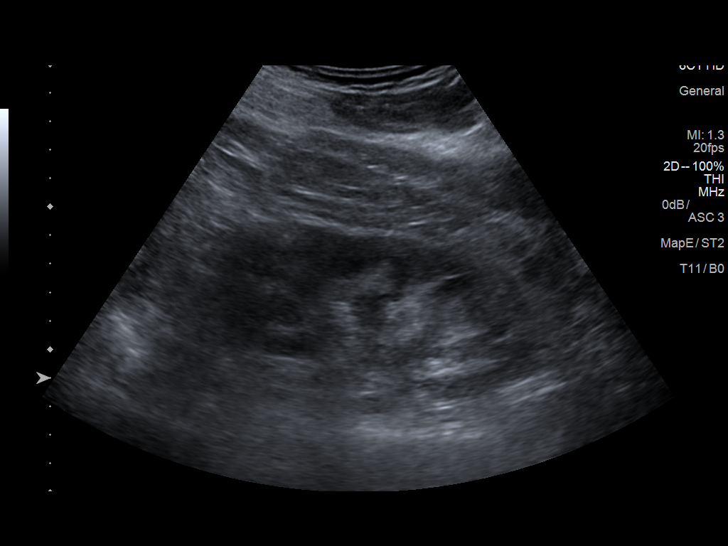
[im 32/52]
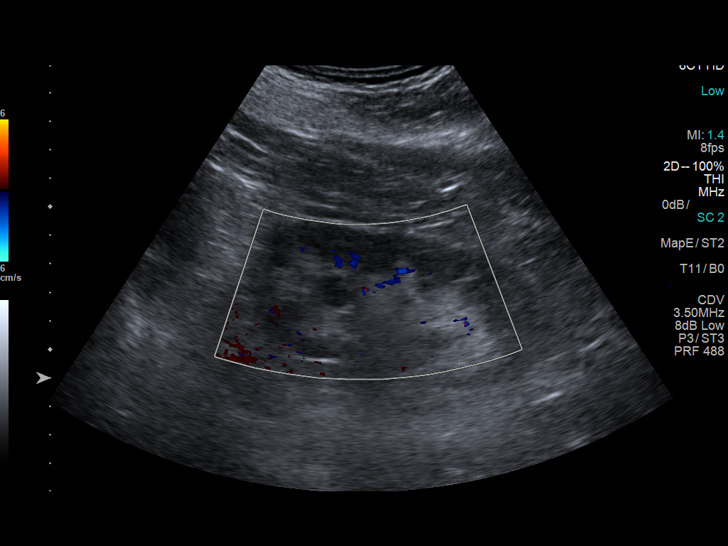
[im 35/52]
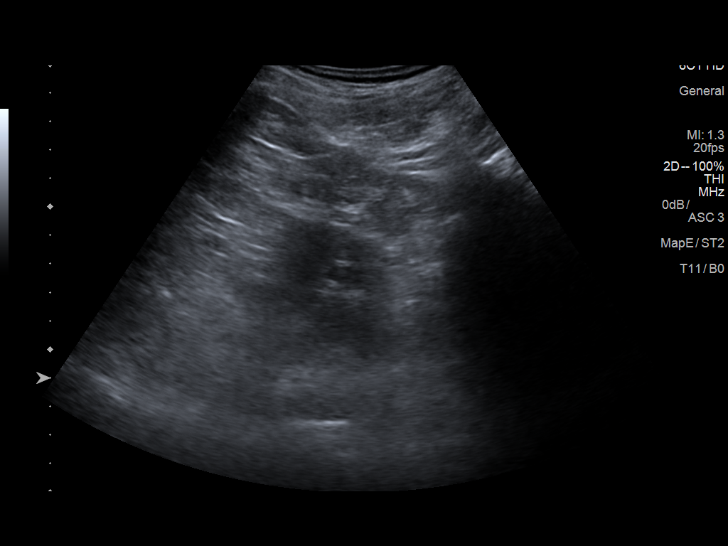
[im 39/52]
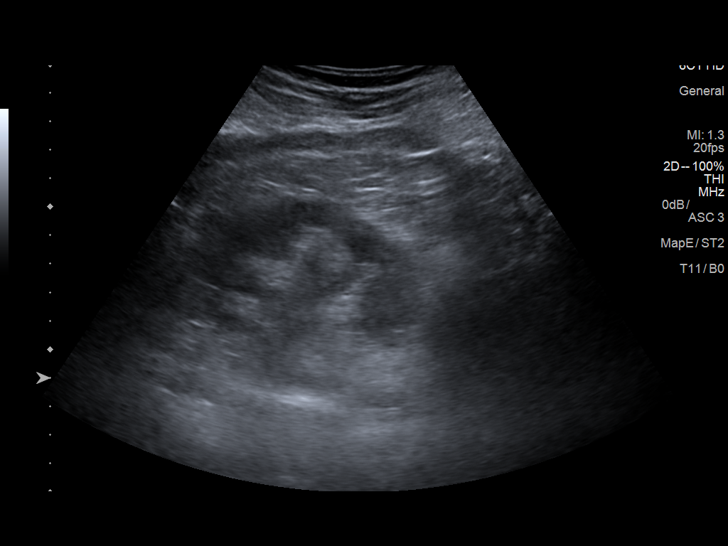
[im 43/52]
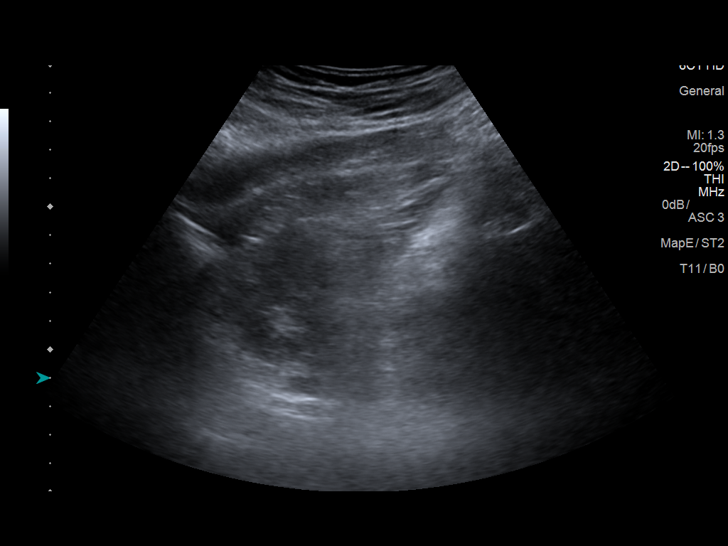
[im 47/52]
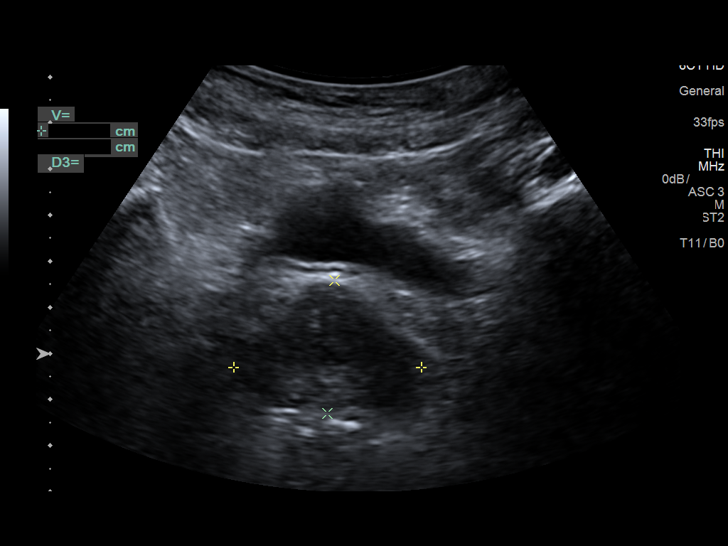
[im 52/52]
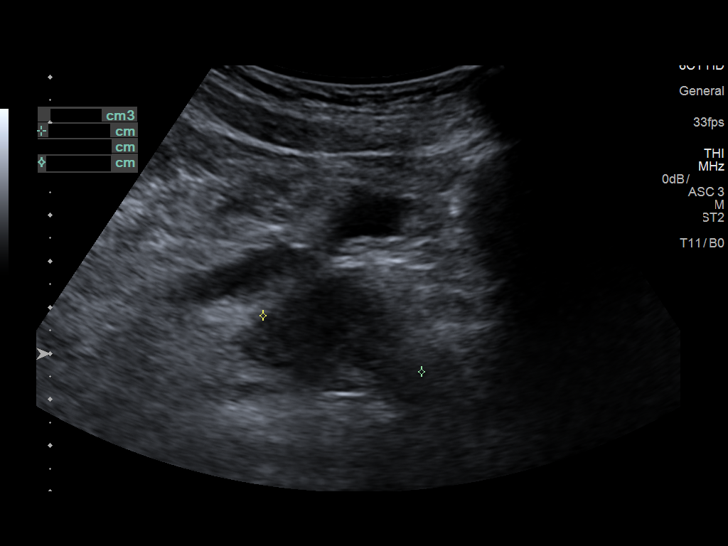

[14 of 25 positions shown; findings below may reference images not displayed]

FINDINGS: Right Kidney:

Renal measurements: 12.0 x 6.0 x 6.4 cm = volume: 239.8 mL .
Echogenicity and renal cortical thickness are within normal limits.
No perinephric fluid or hydronephrosis visualized. There is a cyst
arising from the upper pole left kidney measuring 6.6 x 4.7 x
cm. No sonographically demonstrable calculus or ureterectasis.

Left Kidney:

Renal measurements: 12.2 x 5.6 x 7.1 cm = volume: 255.4 mL.
Echogenicity and renal cortical thickness are within normal limits.
No mass, perinephric fluid, or hydronephrosis visualized. No
sonographically demonstrable calculus or ureterectasis.

Bladder:

Appears normal for degree of bladder distention.

Prostate measures 4.1 x 2.9 x 3.7 cm with a measured volume of
cubic cm.
IMPRESSION: Cyst arising from upper pole left kidney. Study otherwise
unremarkable.

## 2019-10-13 ENCOUNTER — Other Ambulatory Visit: Payer: PRIVATE HEALTH INSURANCE

## 2019-10-13 ENCOUNTER — Encounter: Payer: Self-pay | Admitting: Registered Nurse

## 2019-10-13 ENCOUNTER — Telehealth: Payer: Self-pay | Admitting: Registered Nurse

## 2019-10-13 DIAGNOSIS — Z20822 Contact with and (suspected) exposure to covid-19: Secondary | ICD-10-CM

## 2019-10-13 NOTE — Telephone Encounter (Signed)
HR Tim notified patient scheduled at [facility] site [place], [place] at [place] today for covid testing as [facility] no appts available.  Tim stated he would notify patient.

## 2019-10-13 NOTE — Telephone Encounter (Signed)
Epic verified sample collected at 1510 today.
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
## 2019-10-14 LAB — NOVEL CORONAVIRUS, NAA: SARS-CoV-2, NAA: NOT DETECTED

## 2019-10-14 LAB — SARS-COV-2, NAA 2 DAY TAT

## 2019-10-15 NOTE — Telephone Encounter (Signed)
Per Epic negative covid test. HR Notified patient fully vaccinated and if still asymptomatic no quarantine required.

## 2019-10-17 NOTE — Telephone Encounter (Signed)
Telephone message left for patient covid test negative and to verify still asymptomatic as epic did not note patient viewing test results.  Notified patient can email me at [email or web address] if further questions or concerns today or see RN Hildred Alamin tomorrow in clinic [phone].

## 2019-10-18 NOTE — Telephone Encounter (Signed)
Spoke with pt by phone. Relayed negative test result to him. He denies any symptoms. Feels well. Advised to continue self monitoring, wearing a mask indoors and notify clinic if any sx begin. He verbalizes understanding, agreement. No further questions.

## 2019-10-18 NOTE — Telephone Encounter (Signed)
Noted asymptomatic feeling well and continuing to monitor for symptoms/protect self from covid infection.  Patient verbalized understanding of negative test results from [place] today.

## 2019-11-19 ENCOUNTER — Ambulatory Visit: Payer: Self-pay | Admitting: *Deleted

## 2019-11-22 NOTE — Progress Notes (Signed)
2nd Shingrix vaccine to complete immunization series.

## 2020-12-12 ENCOUNTER — Telehealth: Payer: Self-pay | Admitting: *Deleted

## 2020-12-12 DIAGNOSIS — J029 Acute pharyngitis, unspecified: Secondary | ICD-10-CM

## 2020-12-12 NOTE — Telephone Encounter (Signed)
Pt called out of work today citing sore throat. Reported to HR no fever and a negative covid home test. Called pt to discuss. No answer. LVMRCB.

## 2020-12-13 ENCOUNTER — Encounter: Payer: Self-pay | Admitting: *Deleted

## 2020-12-13 MED ORDER — PENICILLIN V POTASSIUM 500 MG PO TABS: 500.0000 mg | ORAL_TABLET | Freq: Two times a day (BID) | ORAL | 0 refills | Status: AC

## 2020-12-13 NOTE — Telephone Encounter (Signed)
Patient returned call stated throat very sore, difficulty/pain with swallowing, red in the back of his throat.  Asked patient to look with phone flashlight in back of throat to see if any white spots--he stated he did see on both sides of throat white spots.  Will treat for strep throat with penicillin V 500mg  po BID x 10 days #20 RF0 electronic Rx sent to his pharmacy of choice.  Discussed salt water gargles, tylenol 1000mg  po every 6 hours prn pain/swelling, honey 1 tablespoon every 4 hours as needed pain/sore throat, sip iced fluids, drink soup broths as salt helps to soothe throat also.  Work excuse note given to patient for 24 hours and he is to covid retest in am.  If negative may return to work tomorrow if feeling well and no new symptoms and must be fever/vomiting/diarrhea free previous 24 hours also. Usually no specific medical treatment is needed if a virus is causing the sore throat. The throat most often gets better on its own within 5 to 7 days. Antibiotic medicine does not cure viral pharyngitis.  -For acute pharyngitis caused by bacteria, your healthcare provider will prescribe an antibiotic.  -Work excusal for 24 hours as considered infectious until 24 hours of antibiotics on board cleared to start work tomorrow afternoon/evening-take 2 doses penicillin 1 every 12 hours x 10 days #20 RF0 electronic Rx sent to patient pharmacy of choice -If worsening throat pain/dysphagia patient to contact me and will send in Rx for Prednisone 40mg  (4 tabs with breakfast x 3 days) #12 RF0 Throat should improve with tylenol and penicillin over the next 48 hours.  If tylenol not providing enough relief may take Advil/Motrin/Ibuprofen 800mg  by mouth every 8 hours with food or naproxen/naprosyn/aleve 500mg  by mouth every 12 hours with food prn pain/fever OTC -Go to ER/call 911 if drooling, unable to swallow, trouble breathing discussed tonsillar abscess symptoms/hot potato voice. -Do not smoke.  -Avoid secondhand  smoke and other air pollutants.  -Use a cool mist humidifier to add moisture to the air.  -Get plenty of rest; sleep 7-8 hours per night -You may want to rest your throat by talking less and eating a diet that is mostly liquid or soft for a day or two.  -Nonprescription throat lozenges and mouthwashes should help relieve the soreness.  -Gargling with warm saltwater and drinking warm liquids may help. (You can make a saltwater solution by adding 1/4 teaspoon of salt to 8 ounces, or 240 mL, of warm water.)  -Change your toothbrush on your last day of antibiotic -Avoid oral intimate contact e.g. kissing until antibiotics finished -Wash dishes/silverware/glasses in hot water or diswasher -Do not share glasses/silverware/dishes during meals that touch your lips/mouth -DDx mononucleosis, strep throat, hand foot and mouth, post nasal drip irritation pharynx/throat -Exitcare handout on pharyngitis acute and strep throat sent to patient my chart he preferred english version -Contact me if symptoms not improving over the next 48 hours.  RN Hildred Alamin may be calling him tomorrow.  Patient A&Ox3 spoke full sentences without difficulty no audible wheezing/throat clearing/cough/congestion nasal during 8 minute telephone call.  HR notified may return after 24 hours antibiotic use and tomorrow am repeat covid test if negative.  Patient verbalized understanding of instructions and agreed with plan of care.  P2: Hand washing and diet.

## 2020-12-14 NOTE — Telephone Encounter (Signed)
Pt returned phone call. He reports sx are much improved today. Taking PCN and doing salt water gargles. Covid retest this morning was negative. Cleared to RTW. Next scheduled shift Monday 12/12. Denies current needs or concerns.

## 2020-12-14 NOTE — Telephone Encounter (Signed)
Reviewed RN Hildred Alamin note symptoms improved and covid test negative cleared to return onsite/agreed with plan of care.

## 2020-12-14 NOTE — Telephone Encounter (Signed)
Called pt to check covid retest results from this morning and sx check in. No answer. LVMRCB.  Supervisor reported pt did not RTW today 12/8 and that on 12/6, pt told supervisor he did not plan to RTW this week.

## 2020-12-18 NOTE — Telephone Encounter (Signed)
Called to confirm RTW today 12/12. No answer. LVMRCB.

## 2020-12-18 NOTE — Telephone Encounter (Signed)
HR Tonya confirmed patient RTW today as expected.

## 2020-12-24 NOTE — Telephone Encounter (Signed)
Late entry patient returned call 12/17/20 stated symptoms improved on penicillin feeling well and ready to RTW 12/18/20  A&Ox3 spoke full sentences without difficulty throat pain and white spots resolved. No audible throat clearing/congestion or cough during 2 minute telephone call.  HR notified cleared to return onsite 12/18/20  Patient verbalized understanding information/instructions, agreed with plan of care and had no further questions at this time.

## 2021-06-12 ENCOUNTER — Encounter: Payer: Self-pay | Admitting: *Deleted

## 2022-06-23 NOTE — Progress Notes (Signed)
noted 

## 2022-08-12 ENCOUNTER — Encounter: Payer: Self-pay | Admitting: Registered Nurse

## 2022-08-12 ENCOUNTER — Telehealth: Payer: Self-pay | Admitting: Registered Nurse

## 2022-08-12 DIAGNOSIS — R051 Acute cough: Secondary | ICD-10-CM

## 2022-08-12 DIAGNOSIS — R490 Dysphonia: Secondary | ICD-10-CM

## 2022-08-12 MED ORDER — COVID-19 ANTIGEN TEST VI KIT: 1.0000 | PACK | Freq: Every day | 1 refills | Status: DC | PRN

## 2022-08-12 NOTE — Telephone Encounter (Signed)
Patient reported concerned as cough not improving worried it might be covid.  Would like covid testing with clinic today.  Clinic closed today unexpectedly.  Patient unsure if any tests at home unexpired.  Electronic Rx for covid antigen test daily prn #4 RF1 sent to his pharmacy of choice.  Patient to contact me once home today and will check FDA expiration dates for tests he has at home.  He thinks he bought them one year ago.  Patent A&Ox3 spoke full sentences without difficulty no cough/congestion/nasal sniffing or throat clearing audible during 2 minute call.  Patient agreed with plan of care and had no further questions at this time.

## 2022-08-13 MED ORDER — SALINE SPRAY 0.65 % NA SOLN: 2.0000 | NASAL | Status: AC

## 2022-08-13 MED ORDER — FLUTICASONE PROPIONATE 50 MCG/ACT NA SUSP
1.0000 | Freq: Two times a day (BID) | NASAL | Status: AC
Start: 2022-08-13 — End: 2022-10-12

## 2022-08-13 MED ORDER — LORATADINE 10 MG PO TABS: 10.0000 mg | ORAL_TABLET | Freq: Every day | ORAL | Status: AC
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
## 2022-08-13 NOTE — Telephone Encounter (Signed)
Patient contacted NP home covid tests expiration date 08/09/20  Reviewed FDA website and date was extended to April 2023 but these tests are expired and he should purchase new ones to perform any home testing and throw these away.  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  Patient will see NP in am discussed I will arrive to clinic around 0830.  Patient A&Ox3 spoke full sentences without difficulty no audible cough/wheezing/congestion/throat clearing or nasal sniffing during 6 minute call.  Patient agreed with plan of care and had no further questions at this time.  Exitcare handout on hoarseness and vocal cord polyps sent to patient my chart

## 2022-11-06 ENCOUNTER — Ambulatory Visit: Payer: PRIVATE HEALTH INSURANCE

## 2022-11-14 ENCOUNTER — Ambulatory Visit: Payer: Self-pay | Admitting: Registered Nurse

## 2022-11-14 DIAGNOSIS — Z23 Encounter for immunization: Secondary | ICD-10-CM

## 2022-11-14 NOTE — Progress Notes (Signed)
Administered fluad 0.31ml IM right deltoid and comirnaty 0.19ml IM left deltoid applied bandaid.  Completed [facility] immunication patient questionnaire with patient and given VIS observed in clinic no adverse reactions  bandaids clean dry and intact on ambulatory discharge respirations even and unlabored spoke full sentences without difficulty

## 2022-11-14 NOTE — Patient Instructions (Signed)
COVID-19 Vaccine: What You Need to Know Many vaccine information statements are available in Spanish and other languages. See PromoAge.com.br. 1. Why get vaccinated? COVID-19 vaccine can prevent COVID-19 disease. Vaccination can help reduce the severity of COVID-19 disease if you get sick. COVID-19 is caused by a coronavirus called SARS-CoV-2 that spreads easily from person to person. COVID-19 can cause mild to moderate illness lasting only a few days, or severe illness requiring hospitalization, intensive care, or a ventilator to help with breathing. COVID-19 can result in death. If an infected person has symptoms, they may appear 2 to 14 days after exposure to the virus. Anyone can have mild to severe symptoms. Possible symptoms include fever or chills, cough, shortness of breath or difficulty breathing, fatigue (tiredness), muscle or body aches, headache, new loss of taste or smell, sore throat, congestion or runny nose, nausea or vomiting, or diarrhea. More serious symptoms can include trouble breathing, persistent pain or pressure in the chest, new confusion, inability to wake or stay awake, or pale, gray, or blue-colored skin, lips, or nail beds, depending on skin tone. Older adults and people with certain underlying medical conditions (like heart or lung disease or diabetes) are more likely to get very sick from COVID-19. 2. COVID-19 vaccine Updated (2023-2024 Formula) COVID-19 vaccine is recommended for everyone 9 months of age and older. COVID-19 vaccines for infants and children 6 months through 43 years of age are available under Emergency Use Authorization from the U. S. Food and Drug Administration (FDA). Please refer to the Fact Sheets for Recipients and Caregivers for more information. For people 21 years of age and older, updated COVID-19 vaccines, manufactured by [employer]. or [employer], [employer] are approved by [employer]. Everyone 12 years and older should get 1 dose of an FDA-approved,  updated 2023-2024 COVID-19 vaccine. If you have received a COVID-19 vaccine recently, you should wait at least 8 weeks after your most recent dose to get the updated 2023-2024 COVID-19 vaccine. Certain people who have medical conditions or are taking medications that affect the immune system may get additional doses of COVID-19 vaccine. Your health care provider can advise you. Some people 22 years of age and older might get a different COVID-19 vaccine called Novavax COVID-19 Vaccine, Adjuvanted (2023-2024 Formula) instead. This vaccine is available under Emergency Use Authorization from FDA. Please refer to the Fact Sheet for Recipients and Caregivers for more information. 3. Talk with your health care provider Tell your vaccination provider if the person getting the vaccine: Has had an allergic reaction after a previous dose of COVID-19 vaccine or an ingredient in the COVID-19 vaccine, or has any severe, life-threatening allergies Has had myocarditis (inflammation of the heart muscle) or pericarditis (inflammation of the lining outside of the heart) Has had multisystem inflammatory syndrome (called MIS-C in children and MIS-A in adults) Has a weakened immune system In some cases, your health care provider may decide to postpone COVID-19 vaccination until a future visit. People with minor illnesses, such as a cold, may be vaccinated. People who are moderately or severely ill should usually wait until they recover. People with current COVID-19 infection should wait to get vaccinated until they have recovered from their illness and discontinued isolation. Pregnant people with COVID-19 are at increased risk for severe illness. COVID-19 vaccination is recommended for people who are pregnant, breastfeeding, or trying to get pregnant now, or who might become pregnant in the future. COVID-19 vaccine may be given at the same time as other vaccines. 4. Risks of  a vaccine reaction Pain, swelling, or redness  where the shot is given, fever, tiredness (fatigue), headache, chills, muscle pain, joint pain, nausea, vomiting, and swollen lymph nodes can happen after COVID-19 vaccination. Myocarditis (inflammation of the heart muscle) or pericarditis (inflammation of the lining outside the heart) have been seen rarely after COVID-19 vaccination. This risk has been observed most commonly in males 12 through 68 years of age. The chance of this occurring is low. People sometimes faint after medical procedures, including vaccination. Tell your provider if you feel dizzy or have vision changes or ringing in the ears. As with any medicine, there is a very remote chance of a vaccine causing a severe allergic reaction, other serious injury, or death. 5. What if there is a serious problem? An allergic reaction could occur after the vaccinated person leaves the clinic. If you see signs of a severe allergic reaction (hives, swelling of the face and throat, difficulty breathing, a fast heartbeat, dizziness, or weakness), call 9-1-1 and get the person to the nearest hospital. Seek medical attention right away if the vaccinated person experiences chest pain, shortness of breath, or feelings of having a fast-beating, fluttering, or pounding heart after COVID-19 vaccination. These could be symptoms of myocarditis or pericarditis. For other signs that concern you, call your health care provider. Adverse reactions should be reported to the Vaccine Adverse Event Reporting System (VAERS). Your health care provider will usually file this report, or you can do it yourself. Visit the VAERS website at www.vaers.LAgents.no or call 630-690-0863. VAERS is only for reporting reactions, and VAERS staff members do not give medical advice. 6. Countermeasures Injury Compensation Program The Countermeasures Injury Compensation Program (CICP) is a federal program that may help pay for costs of medical care and other specific expenses of certain people  who have been seriously injured by certain medicines or vaccines, including this vaccine. Generally, a claim must be submitted to the CICP within one (1) year from the date of receiving the vaccine. To learn more about this program, visit the program's website at NoSpeaking.tn, or call (718)874-5241. 7. How can I learn more? Ask your health care provider. Call your local or state health department. Visit the website of the Food and Drug Administration (FDA) for COVID-19 Fact Sheets, package inserts, and additional information at CartCleaning.be. Contact the Centers for Disease Control and Prevention (CDC): Call (959)253-6336 (1-800-CDC-INFO) or Visit CDC's COVID-19 vaccines website at https://www.clark-whitaker.org/ Source: CDC Vaccine Information Statement COVID-19 Vaccine (10/25/2021) This same material is available at FootballExhibition.com.br for no charge. This information is not intended to replace advice given to you by your health care provider. Make sure you discuss any questions you have with your health care provider. Document Revised: 04/10/2022 Document Reviewed: 01/10/2022 Elsevier Patient Education  2024 Elsevier Inc. [place] antigripal (inactivada o recombinante): lo que debe saber Influenza (Flu) Vaccine (Inactivated or Recombinant): What You Need to Know 1. Por qu vacunarse? La vacuna antigripal puede prevenir la gripe. La gripe es una enfermedad contagiosa que se disemina en los [place] cada ao, por lo general, Eusebio Me octubre y [place]. Cualquier persona puede contraer gripe, pero es ms peligrosa para Runner, broadcasting/film/video. Los bebs y los nios pequeos, los [employer] de 65 aos y las Financial planner, as [employer] personas que tienen ciertas enfermedades o cuyo sistema inmunitario est debilitado, corren un riesgo mayor de tener complicaciones debido a la gripe. La neumona, la bronquitis, las infecciones  de los senos paranasales y las infecciones de odos son ejemplos de  complicaciones relacionadas con la gripe. Si tiene una afeccin, por ejemplo, enfermedad cardaca, cncer o diabetes, la gripe puede empeorarla. La gripe puede causar fiebre y escalofros, dolor de garganta, dolores musculares, fatiga, tos, dolor de [place] y secrecin o congestin nasal. Algunas personas pueden tener vmitos y Barnett Hatter, [place] esto es ms frecuente en los nios que en los adultos. En un ao promedio, miles de [employer] [place] debido a la gripe, y muchas ms deben ser hospitalizadas. Cada ao, la vacuna antigripal previene millones de enfermedades y evita visitas al mdico relacionadas con la gripe. 2. Vacunas antigripales Los CDC (Centros para el Control y la Prevencin de [place]) recomiendan que todas las personas a Glass blower/designer de los 6 meses de edad se vacunen cada temporada de gripe. Es posible que los nios de 6 meses a 8 aos deban recibir 2 dosis durante la misma temporada de gripe. Todas las dems personas tienen que aplicarse 1 sola dosis cada temporada de gripe. La vacuna comienza a surtir Librarian, academic 2 semanas despus de su aplicacin. Hay muchos virus de la gripe, y Estate agent. Cada ao, se elabora una nueva vacuna antigripal para brindar proteccin contra los virus gripales que se cree probablemente causen la enfermedad en la siguiente temporada de gripe. Incluso si la vacuna no es especfica para esos virus, aun as puede brindar cierta proteccin. La vacuna antigripal no causa gripe. La vacuna antigripal puede administrarse al mismo tiempo que otras vacunas. 3. Hable con el mdico Comunquese con la persona que le coloca las vacunas si la persona que la recibe: Ha tenido una reaccin alrgica despus de Neomia Dear dosis previa de la vacuna antigripal o tiene alguna alergia grave, potencialmente mortal Alguna vez tuvo sndrome de Pension scheme manager (tambin llamado  "SGB") En algunos casos, es posible que el mdico decida posponer la aplicacin de la vacuna antigripal hasta una visita en el futuro. La vacuna antigripal puede administrarse en cualquier momento durante el embarazo. Las personas que estn o vayan a Microbiologist durante la temporada de gripe deben recibir la vacuna antigripal inactivada. Las personas que sufren trastornos menores, como un resfro, pueden vacunarse. Las personas que tienen enfermedades moderadas o graves generalmente deben esperar hasta recuperarse para poder recibir la vacuna antigripal. Su mdico puede darle ms informacin. 4. Riesgos de [place] reaccin a la vacuna Despus de recibir la vacunacin antigripal, Barista, enrojecimiento e hinchazn en el lugar de la inyeccin, fiebre, dolores musculares y Engineer, mining de [place]. Puede haber un pequeo aumento del riesgo de sufrir sndrome de Pension scheme manager (SGB) despus de la aplicacin de la vacuna antigripal inactivada. Los nios pequeos que reciben la vacuna antigripal junto con la vacuna antineumoccica (PCV13) o la DTaP en el mismo momento pueden tener una probabilidad un poco ms elevada de tener una convulsin debido a la fiebre. Informe al mdico si un nio que est recibiendo la vacuna antigripal ha tenido una convulsin alguna vez. Las personas a veces se desmayan despus de procedimientos mdicos, incluida la vacunacin. Informe al mdico si se siente mareado, tiene cambios en la visin o zumbidos en los odos. Al igual que con cualquier Automatic Data, existe una probabilidad muy remota de que una vacuna cause una reaccin alrgica grave, otra lesin grave o la muerte. 5. Qu pasa si se presenta un problema grave? Podra producirse una reaccin alrgica despus de que la persona vacunada abandone la clnica. Si observa signos de Runner, broadcasting/film/video grave (ronchas, hinchazn de la cara y la garganta, dificultad para  respirar, latidos cardacos acelerados, mareos o  debilidad), llame al 9-1-1 y lleve a la persona al hospital ms cercano. Si se presentan otros signos que le preocupan, comunquese con su mdico. Las reacciones adversas deben informarse al Sistema de Informe de Eventos Adversos de Administrator, arts (Vaccine Adverse Event Reporting System, VAERS). Por lo general, el mdico presenta este informe o puede hacerlo usted mismo. Visite el sitio web del VAERS en www.vaers.LAgents.no o llame al [phone]. El VAERS es solo para Biomedical engineer, y los miembros de su personal no proporcionan asesoramiento mdico. 6. Programa Nacional de Compensacin de Daos por [employer] El [employer] de Compensacin de Daos por Administrator, arts (National Vaccine Injury [employer], Cabin crew) es un programa federal que fue creado para Patent examiner a las personas que puedan haber sufrido daos al recibir ciertas vacunas. Las Investment banker, operational a presuntas lesiones o muerte debidas a la vacunacin tienen un lmite de tiempo para su presentacin, que puede ser de tan solo [employer]. Visite el sitio web del VICP en SpiritualWord.at o llame al [phone] para obtener ms informacin acerca del programa y de cmo presentar un reclamo. 7. Cmo puedo obtener ms informacin? Pregntele a su mdico. Comunquese con el servicio de salud de su localidad o su estado. Visite el sitio web de Tax inspector) (Administracin de Alimentos y Media planner) para ver los prospectos de las vacunas e informacin adicional en [email or web address]. Comunquese con Building control surveyor for [employer]) (Centros para el Control y la Prevencin de [place]): Llame al [phone] (1-800-CDC-INFO) o Visite el sitio web de los CDC en BiotechRoom.com.cy. Fuente: Declaracin de informacin de los CDC sobre la vacuna antigripal inactivada (08/13/2019) Este mismo material est disponible en FootballExhibition.com.br sin cargo. Esta informacin  no tiene Theme park manager el consejo del mdico. Asegrese de hacerle al mdico cualquier pregunta que tenga. Document Revised: 10/01/2021 Document Reviewed: 10/01/2021 Elsevier Patient Education  2023 [employer].

## 2023-01-14 ENCOUNTER — Ambulatory Visit: Payer: Self-pay | Admitting: Registered Nurse

## 2023-01-14 VITALS — HR 72 | Temp 98.2°F | Resp 16

## 2023-01-14 DIAGNOSIS — Z20822 Contact with and (suspected) exposure to covid-19: Secondary | ICD-10-CM

## 2023-01-16 ENCOUNTER — Encounter: Payer: Self-pay | Admitting: Registered Nurse

## 2023-01-16 NOTE — Patient Instructions (Signed)
 Infection Prevention in the Home If you have an infection, may have been exposed to an infection, or are taking care of someone who has an infection, it is important to know how to keep the infection from spreading. Follow your health care provider's instructions and use these guidelines to help stop the spread of infection. How infections are spread In order for an infection to spread, the following must be present: A germ. This may be a virus, bacteria, fungus, or parasite. A place for the germ to live. This may be: On or in a person, animal, plant, or food. In soil or water. On surfaces, such as a door handle. A person or animal who can develop a disease if the germ enters the body (host). The host does not have resistance to the germ. A way for the germ to enter the host. This may occur by: Direct contact with an infected person or animal. This can happen through shaking hands or hugging. Some germs can also travel through the air and spread to others. This can happen when an infected person coughs or sneezes on or near other people. Indirect contact. This occurs when the germ enters the host through contact with an infected object. Examples include: Eating or drinking food or water that is contaminated with the germ. Touching a contaminated surface with your hands, and then touching your face, eyes, nose, or mouth. Supplies needed: Soap. Alcohol-based hand sanitizer. Standard cleaning products. Disinfectants, such as bleach. Reusable cleaning cloths, sponges, or paper towels. Disposable or reusable utility gloves. How to prevent infection from spreading There are several things that you can do to help prevent infection from spreading. Take these general actions Everyone should take the following actions to prevent the spread of infection: Wash your hands often with soap and water for at least 20 seconds. If soap and water are not available, use alcohol-based hand sanitizer. Avoid  touching your face, mouth, nose, or eyes. Cough or sneeze into a tissue, sleeve, or elbow instead of into your hand or into the air. If you cough or sneeze into a tissue, throw it away immediately and wash your hands.  Keep your bathroom clean Provide soap. Change towels and washcloths frequently. Change toothbrushes often and store them separately in a clean, dry place. Clean and disinfect all surfaces, including the toilet, floor, tub, shower, and sink. Do not share personal items, such as razors, toothbrushes, deodorant, combs, brushes, towels, and washcloths. Maintain hygiene in the [facility] your hands before and after preparing food and before you eat. Clean the inside of your refrigerator each week. Keep your refrigerator set at 349F (4C) or less, and set your freezer at 49F (-18C) or less. Keep work surfaces clean. Disinfect them regularly. Wash your dishes in hot, soapy water. Air-dry your dishes or use a dishwasher. Do not share dishes or eating utensils. Handle food safely Store food carefully. Refrigerate leftovers promptly in covered containers. Throw out stale or spoiled food. Thaw foods in the refrigerator or microwave, not at room temperature. Serve foods at the proper temperature. Do not eat raw meat. Make sure it is cooked to the appropriate temperature. Cook eggs until they are firm. Wash fruits and vegetables under running water. Use separate cutting boards, plates, and utensils for raw foods and cooked foods. Use a clean spoon each time you sample food while cooking. Do laundry the right way Wear gloves if laundry is visibly soiled. Do not shake soiled laundry. Doing that may send germs  into the air. Wash laundry in hot water. If you cannot wash the laundry right away, place it in a plastic bag and wash it as soon as possible. Be careful around animals and pets Wash your hands before and after touching animals. If you have a pet, ensure that your pet stays  clean. Do not let people with weak immune systems touch bird droppings, fish tank water, or a litter box. If you have a pet cage or litter box, be sure to clean it every day. If you are sick, stay away from animals and have someone else care for them if possible. How to clean and disinfect objects and surfaces Precautions Some disinfectants work for certain germs and not others. Read the manufacturer's instructions or read online resources to determine if the product you are using will work for the germ you are trying to remove. If you choose to use bleach, use it safely. Never mix it with other cleaning products, especially those that contain ammonia. This mixture can create a dangerous gas that may be deadly. Keep proper movement of fresh air in your home (ventilation). Pour used mop water down the utility sink or toilet. Do not pour this water down the kitchen sink. Objects and surfaces  If surfaces are visibly soiled, clean them first with soap and water before disinfecting. Disinfect surfaces that are frequently touched every day. This may include: Counters. Tables. Doorknobs. Sinks and faucets. Electronics, such as: [employer]. Remote controls. Keyboards. Computers and tablets. Cleaning supplies Some cleaning supplies can breed germs. Take good care of them to prevent germs from spreading. To do this: Soak toilet brushes, mops, and sponges in bleach and water for 5 minutes after each use, or according to manufacturer's instructions. Wash reusable cleaning cloths and sanitize sponges after each use. Throw away disposable gloves after one use. Replace reusable utility gloves if they are cracked or torn or if they start to peel. Additional actions if you are sick If you live with other people:  Avoid close contact with those around you. Stay at least 3 ft (1 m) away from others, if possible. Use a separate bathroom, if possible. If possible, sleep in a separate bedroom or in a  separate bed to prevent infecting other household members. Change bedroom linens each week or whenever they are soiled. Have everyone in the household wash hands often with soap and water for at least 20 seconds. If soap and water are not available, use alcohol-based hand sanitizer. In general: Stay home except to get medical care. Call ahead before visiting your health care provider. Ask others to get groceries and household supplies and to refill prescriptions for you. Avoid public areas. Try not to take public transportation. If you can, wear a mask if you need to go out of the house, or if you are in close contact with someone who is not sick. Avoid visitors until you have completely recovered, or until you have no signs and symptoms of infection. Avoid preparing food or providing care for others. If you must prepare food or provide care for others, wear a mask and wash your hands before and after doing these things. Where to find more information Centers for Disease Control and Prevention: tonerpromos.no Summary It is important to know how to keep infection from spreading. Make sure everyone in your household washes their hands often with soap and water. Disinfect surfaces that are frequently touched every day. If you are sick, stay home except to get medical care. This  information is not intended to replace advice given to you by your health care provider. Make sure you discuss any questions you have with your health care provider. Document Revised: 02/12/2021 Document Reviewed: 02/12/2021 Elsevier Patient Education  2024 [employer]    Person Under Monitoring Name: Brian Long  Location: [address] [place] [address]   Infection Prevention Recommendations for Individuals Confirmed to have, or Being Evaluated for, 2019 Novel Coronavirus (COVID-19) Infection Who Receive Care at Home  Individuals who are confirmed to have, or are being evaluated for, COVID-19  should follow the prevention steps below until a healthcare provider or local or state health department says they can return to normal activities.  Stay home except to get medical care You should restrict activities outside your home, except for getting medical care. Do not go to work, school, or public areas, and do not use public transportation or taxis.  Call ahead before visiting your doctor Before your medical appointment, call the healthcare provider and tell them that you have, or are being evaluated for, COVID-19 infection. This will help the healthcare provider's office take steps to keep other people from getting infected. Ask your healthcare provider to call the local or state health department.  Monitor your symptoms Seek prompt medical attention if your illness is worsening (e.g., difficulty breathing). Before going to your medical appointment, call the healthcare provider and tell them that you have, or are being evaluated for, COVID-19 infection. Ask your healthcare provider to call the local or state health department.  Wear a facemask You should wear a facemask that covers your nose and mouth when you are in the same room with other people and when you visit a healthcare provider. People who live with or visit you should also wear a facemask while they are in the same room with you.  Separate yourself from other people in your home As much as possible, you should stay in a different room from other people in your home. Also, you should use a separate bathroom, if available.  Avoid sharing household items You should not share dishes, drinking glasses, cups, eating utensils, towels, bedding, or other items with other people in your home. After using these items, you should wash them thoroughly with soap and water.  Cover your coughs and sneezes Cover your mouth and nose with a tissue when you cough or sneeze, or you can cough or sneeze into your sleeve. Throw used  tissues in a lined trash can, and immediately wash your hands with soap and water for at least 20 seconds or use an alcohol-based hand rub.  Wash your [employer] your hands often and thoroughly with soap and water for at least 20 seconds. You can use an alcohol-based hand sanitizer if soap and water are not available and if your hands are not visibly dirty. Avoid touching your eyes, nose, and mouth with unwashed hands.   Prevention Steps for Caregivers and Household Members of Individuals Confirmed to have, or Being Evaluated for, COVID-19 Infection Being Cared for in the Home  If you live with, or provide care at home for, a person confirmed to have, or being evaluated for, COVID-19 infection please follow these guidelines to prevent infection:  Follow healthcare provider's instructions Make sure that you understand and can help the patient follow any healthcare provider instructions for all care.  Provide for the patient's basic needs You should help the patient with basic needs in the home and provide support for getting  groceries, prescriptions, and other personal needs.  Monitor the patient's symptoms If they are getting sicker, call his or her medical provider and tell them that the patient has, or is being evaluated for, COVID-19 infection. This will help the healthcare provider's office take steps to keep other people from getting infected. Ask the healthcare provider to call the local or state health department.  Limit the number of people who have contact with the patient If possible, have only one caregiver for the patient. Other household members should stay in another home or place of residence. If this is not possible, they should stay in another room, or be separated from the patient as much as possible. Use a separate bathroom, if available. Restrict visitors who do not have an essential need to be in the home.  Keep older adults, very young children, and other sick  people away from the patient Keep older adults, very young children, and those who have compromised immune systems or chronic health conditions away from the patient. This includes people with chronic heart, lung, or kidney conditions, diabetes, and cancer.  Ensure good ventilation Make sure that shared spaces in the home have good air flow, such as from an air conditioner or an opened window, weather permitting.  Wash your hands often Wash your hands often and thoroughly with soap and water for at least 20 seconds. You can use an alcohol based hand sanitizer if soap and water are not available and if your hands are not visibly dirty. Avoid touching your eyes, nose, and mouth with unwashed hands. Use disposable paper towels to dry your hands. If not available, use dedicated cloth towels and replace them when they become wet.  Wear a facemask and gloves Wear a disposable facemask at all times in the room and gloves when you touch or have contact with the patient's blood, body fluids, and/or secretions or excretions, such as sweat, saliva, sputum, nasal mucus, vomit, urine, or feces.  Ensure the mask fits over your nose and mouth tightly, and do not touch it during use. Throw out disposable facemasks and gloves after using them. Do not reuse. Wash your hands immediately after removing your facemask and gloves. If your personal clothing becomes contaminated, carefully remove clothing and launder. Wash your hands after handling contaminated clothing. Place all used disposable facemasks, gloves, and other waste in a lined container before disposing them with other household waste. Remove gloves and wash your hands immediately after handling these items.  Do not share dishes, glasses, or other household items with the patient Avoid sharing household items. You should not share dishes, drinking glasses, cups, eating utensils, towels, bedding, or other items with a patient who is confirmed to have, or  being evaluated for, COVID-19 infection. After the person uses these items, you should wash them thoroughly with soap and water.  Wash laundry thoroughly Immediately remove and wash clothes or bedding that have blood, body fluids, and/or secretions or excretions, such as sweat, saliva, sputum, nasal mucus, vomit, urine, or feces, on them. Wear gloves when handling laundry from the patient. Read and follow directions on labels of laundry or clothing items and detergent. In general, wash and dry with the warmest temperatures recommended on the label.  Clean all areas the individual has used often Clean all touchable surfaces, such as counters, tabletops, doorknobs, bathroom fixtures, toilets, phones, keyboards, tablets, and bedside tables, every day. Also, clean any surfaces that may have blood, body fluids, and/or secretions or excretions on them. Wear  gloves when cleaning surfaces the patient has come in contact with. Use a diluted bleach solution (e.g., dilute bleach with 1 part bleach and 10 parts water) or a household disinfectant with a label that says EPA-registered for coronaviruses. To make a bleach solution at home, add 1 tablespoon of bleach to 1 quart (4 cups) of water. For a larger supply, add  cup of bleach to 1 gallon (16 cups) of water. Read labels of cleaning products and follow recommendations provided on product labels. Labels contain instructions for safe and effective use of the cleaning product including precautions you should take when applying the product, such as wearing gloves or eye protection and making sure you have good ventilation during use of the product. Remove gloves and wash hands immediately after cleaning.  Monitor yourself for signs and symptoms of illness Caregivers and household members are considered close contacts, should monitor their health, and will be asked to limit movement outside of the home to the extent possible. Follow the monitoring steps for  close contacts listed on the symptom monitoring form.   ? If you have additional questions, contact your local health department or call the epidemiologist on call at [phone] (available 24/7). ? This guidance is subject to change. For the most up-to-date guidance from [facility], please refer to their website: tripmetro.hu

## 2023-01-16 NOTE — Progress Notes (Signed)
 Subjective:    Patient ID: Brian Long, male    DOB: [birth year], 68 y.o.   MRN: [id number]  67y/o hispanic male established patient here for evaluation and notification close contact by coworker who tested positive for covid today.  Patient stated chronic cough unchanged denied rhinitis/sore throat/fever/chills/n/v/d/headache/fatigue/body aches.  Has covid tests at home but thinks they are expired.  Has been otherwise feeling well and denied concerns.      Review of Systems  Constitutional:  Negative for chills, diaphoresis, fatigue and fever.  HENT:  Negative for congestion, postnasal drip, rhinorrhea, sneezing, sore throat, trouble swallowing and voice change.   Eyes:  Negative for photophobia, discharge and visual disturbance.  Respiratory:  Positive for cough. Negative for choking, chest tightness, shortness of breath, wheezing and stridor.   Cardiovascular:  Negative for chest pain, palpitations and leg swelling.  Gastrointestinal:  Negative for diarrhea, nausea and vomiting.  Genitourinary:  Negative for difficulty urinating.  Musculoskeletal:  Negative for back pain, gait problem, myalgias, neck pain and neck stiffness.  Skin:  Negative for rash.  Neurological:  Negative for dizziness, tremors, seizures, syncope, facial asymmetry, speech difficulty, weakness, light-headedness, numbness and headaches.  Hematological:  Negative for adenopathy. Does not bruise/bleed easily.  Psychiatric/Behavioral:  Negative for agitation, confusion and sleep disturbance.        Objective:   Physical Exam Vitals reviewed.  Constitutional:      General: He is awake. He is not in acute distress.    Appearance: Normal appearance. He is well-developed, well-groomed and normal weight. He is not ill-appearing, toxic-appearing or diaphoretic.  HENT:     Head: Normocephalic and atraumatic.     Jaw: There is normal jaw occlusion.     Salivary Glands: Right salivary gland is not diffusely  enlarged or tender. Left salivary gland is not diffusely enlarged or tender.     Right Ear: Hearing and external ear normal. No decreased hearing noted.     Left Ear: Hearing and external ear normal. No decreased hearing noted.     Nose: Nose normal. No congestion or rhinorrhea.     Right Nostril: No epistaxis.     Left Nostril: No epistaxis.     Right Turbinates: Not enlarged, swollen or pale.     Left Turbinates: Not enlarged, swollen or pale.     Right Sinus: No maxillary sinus tenderness or frontal sinus tenderness.     Left Sinus: No maxillary sinus tenderness or frontal sinus tenderness.     Mouth/Throat:     Lips: Pink. No lesions.     Mouth: Mucous membranes are moist. No oral lesions or angioedema.     Dentition: No gum lesions.     Tongue: No lesions. Tongue does not deviate from midline.     Palate: No mass and lesions.     Pharynx: Oropharynx is clear. Uvula midline. No pharyngeal swelling, oropharyngeal exudate, posterior oropharyngeal erythema, uvula swelling or postnasal drip.     Tonsils: No tonsillar exudate.  Eyes:     General: Lids are normal. Vision grossly intact. Gaze aligned appropriately. Allergic shiner present. No scleral icterus.       Right eye: No discharge.        Left eye: No discharge.     Extraocular Movements: Extraocular movements intact.     Conjunctiva/sclera: Conjunctivae normal.     Pupils: Pupils are equal, round, and reactive to light.  Neck:     Trachea: Trachea and phonation normal.  Cardiovascular:  Rate and Rhythm: Normal rate and regular rhythm.     Pulses: Normal pulses.          Radial pulses are 2+ on the right side and 2+ on the left side.     Heart sounds: Normal heart sounds, S1 normal and S2 normal.  Pulmonary:     Effort: Pulmonary effort is normal. No respiratory distress.     Breath sounds: Normal breath sounds and air entry. No stridor, decreased air movement or transmitted upper airway sounds. No decreased breath sounds,  wheezing, rhonchi or rales.     Comments: Spoke full sentences without difficulty; no cough observed in exam room Abdominal:     General: Abdomen is flat.     Palpations: Abdomen is soft.  Musculoskeletal:        General: Normal range of motion.     Right hand: Normal strength. Normal capillary refill.     Left hand: Normal strength. Normal capillary refill.     Cervical back: Normal range of motion and neck supple. No swelling, edema, deformity, erythema, signs of trauma, lacerations, rigidity, spasms, torticollis, tenderness or crepitus. No pain with movement or muscular tenderness. Normal range of motion.     Thoracic back: No swelling, edema, deformity, signs of trauma, lacerations, spasms or tenderness. Normal range of motion.     Right lower leg: No edema.     Left lower leg: No edema.  Lymphadenopathy:     Head:     Right side of head: No submental, submandibular, tonsillar, preauricular, posterior auricular or occipital adenopathy.     Left side of head: No submental, submandibular, tonsillar, preauricular, posterior auricular or occipital adenopathy.     Cervical: No cervical adenopathy.     Right cervical: No superficial, deep or posterior cervical adenopathy.    Left cervical: No superficial, deep or posterior cervical adenopathy.  Skin:    General: Skin is warm and dry.     Capillary Refill: Capillary refill takes less than 2 seconds.     Coloration: Skin is not ashen, cyanotic, jaundiced, mottled, pale or sallow.     Findings: No abrasion, abscess, acne, bruising, burn, ecchymosis, erythema, signs of injury, laceration, lesion, petechiae, rash or wound.     Nails: There is no clubbing.  Neurological:     General: No focal deficit present.     Mental Status: He is alert and oriented to person, place, and time. Mental status is at baseline.     GCS: GCS eye subscore is 4. GCS verbal subscore is 5. GCS motor subscore is 6.     Cranial Nerves: No cranial nerve deficit,  dysarthria or facial asymmetry.     Motor: Motor function is intact. No weakness, tremor, atrophy, abnormal muscle tone or seizure activity.     Coordination: Coordination is intact. Coordination normal.     Gait: Gait is intact. Gait normal.     Comments: In/out of chair without difficulty; gait sure and steady in clinic; bilateral hand grasp equal 5/5  Psychiatric:        Attention and Perception: Attention and perception normal.        Mood and Affect: Mood and affect normal.        Speech: Speech normal.        Behavior: Behavior normal. Behavior is cooperative.        Thought Content: Thought content normal.        Cognition and Memory: Cognition and memory normal.  Judgment: Judgment normal.     Home covid test results negative.      Assessment & Plan:   A-close covid contact  P-given 1 free [place]  govt home covid test to perform prior to returning to workcenter.  Discussed new covid variant in [place]  dominant past 2 weeks.  Discussed symptoms typically congestion/runny nose/cough/sore throat from what we have seen in clinic.  Reviewed possible Covid symptoms including cough, shortness of breath with exertion or at rest, runny nose, congestion, sinus pain/pressure, sore throat, fever/chills, body aches, fatigue, loss of taste/smell, GI symptoms of nausea/vomiting/diarrhea. Also reviewed same day/emergent eval/ER precautions of dizziness/syncope, confusion, blue tint to lips/face, severe shortness of breath/difficulty breathing/wheezing.  Discussed home covid tests many had extension of expiration date and FDA site lists new dates.  Patient can take picture of box and send to me and I can assist him with verification of expiration date.  New rx sent electronically to his pharmacy of choice covid antigen test UUD #4 RF1.  Discussed allowed 4 free tests with his insurance per month to notify me if he needs another Rx sent.  Discussed if today negative will test again in 6 days unless  symptoms develop then to test every other day x 3 if initial tests negative.  If he is positive I recommend family members in household to test today also.  I recommend that he isolate and wear mask when around others for the next 10 days and monitor for symptoms.  Avoid eating in employee lunch room for the next 10 days.  May eat outside in his car or discuss with HR team member which room is available for him to use.   Patient is up to date on covid vaccines. Patient is to stay home and notify clinic staff if positive test in the next 10 days.  [employer] handouts on covid quarantine/home care sent to patient my chart Avoid dehydration and drink water to keep urine pale yellow clear and voiding every 2-4 hours while awake.  Patient alert and oriented x3, spoke full sentences without difficulty.  Discussed with patient can contact NP Ellouise through my [phone] when clinic closed if questions or concerns until RN returns to clinic on Monday-Friday 8a-5p [phone].  Pt verbalized understanding and agreement with plan of care. No further questions/concerns at this time. Pt reminded to contact clinic with any changes in symptoms or questions/concerns.

## 2023-05-22 ENCOUNTER — Encounter: Payer: Self-pay | Admitting: Registered Nurse

## 2023-05-22 ENCOUNTER — Telehealth: Payer: Self-pay | Admitting: Registered Nurse

## 2023-05-22 DIAGNOSIS — J069 Acute upper respiratory infection, unspecified: Secondary | ICD-10-CM

## 2023-05-22 MED ORDER — COVID-19 ANTIGEN TEST VI KIT: 1.0000 | PACK | Freq: Every day | 1 refills | Status: AC | PRN

## 2023-05-22 NOTE — Telephone Encounter (Signed)
 Patient returned call stated bought over the counter cough and cold medication this am took nap and feeling much better cough/congestion/runny nose has improved greatly.  Denied n/v/d/f/c.  Has not home covid tested.  Does not have any covid tests at home.  Sent Rx for covid antigen test [facility] daily prn #4 RF1 to his pharmacy of choice.  Patient A&Ox3 spoke full sentences without difficulty respirations even and unlabored no audible throat clearing/nasal sniffing/congestion during 3 minute call.  Patient to home covid test and as long as test negative and no fever/vomiting/diarrhea new or worsening symptoms overnight may return to work tomorrow.  HR and supervisor notified excused absence for today.  Patient agreed with plan of care, verbalized understanding information/instructions and had no further questions at this time.

## 2023-05-26 NOTE — Telephone Encounter (Signed)
 Patient reported home covid test was negative on Thursday and returned to work Friday without difficulty.  All symptoms resolved over the weekend and worked today without difficulty. A&OX3 spoke full sentences without difficulty no audible cough/congestion/throat clearing during 3 minute call.

## 2023-11-24 ENCOUNTER — Ambulatory Visit: Admitting: General Practice
